# Patient Record
Sex: Male | Born: 1955 | Race: White | Hispanic: No | Marital: Married | Smoking: Never smoker
Health system: Southern US, Community
[De-identification: ages and names within clinical notes are randomized; demographics above are authoritative.]

## PROBLEM LIST (undated history)

## (undated) DIAGNOSIS — I10 Essential (primary) hypertension: Secondary | ICD-10-CM

## (undated) HISTORY — PX: CHOLECYSTECTOMY: SHX55

## (undated) HISTORY — DX: Essential (primary) hypertension: I10

## (undated) HISTORY — PX: TONSILLECTOMY: SUR1361

## (undated) HISTORY — PX: VENTRAL HERNIA REPAIR: SHX424

---

## 2011-09-12 DIAGNOSIS — F32A Depression, unspecified: Secondary | ICD-10-CM | POA: Insufficient documentation

## 2011-10-10 DIAGNOSIS — G2581 Restless legs syndrome: Secondary | ICD-10-CM | POA: Insufficient documentation

## 2012-02-27 DIAGNOSIS — I10 Essential (primary) hypertension: Secondary | ICD-10-CM | POA: Insufficient documentation

## 2012-02-27 DIAGNOSIS — F410 Panic disorder [episodic paroxysmal anxiety] without agoraphobia: Secondary | ICD-10-CM | POA: Insufficient documentation

## 2012-03-30 DIAGNOSIS — M549 Dorsalgia, unspecified: Secondary | ICD-10-CM | POA: Insufficient documentation

## 2012-05-24 DIAGNOSIS — R4020 Unspecified coma: Secondary | ICD-10-CM | POA: Insufficient documentation

## 2012-05-24 DIAGNOSIS — G4752 REM sleep behavior disorder: Secondary | ICD-10-CM | POA: Insufficient documentation

## 2012-11-19 DIAGNOSIS — R7303 Prediabetes: Secondary | ICD-10-CM | POA: Insufficient documentation

## 2014-03-23 DIAGNOSIS — E7800 Pure hypercholesterolemia, unspecified: Secondary | ICD-10-CM | POA: Insufficient documentation

## 2014-10-11 ENCOUNTER — Ambulatory Visit
Admit: 2014-10-11 | Disposition: A | Discharge status: Home / Self Care | Payer: Self-pay | Attending: Internal Medicine | Admitting: Internal Medicine

## 2014-10-11 LAB — RAPID STREP-A WITH REFLX: Micro Text Report: POSITIVE

## 2016-10-15 IMAGING — CR DG CHEST 2V
2 series · 2 of 2 positions shown · non-contrast
Comparison: None.

CLINICAL DATA: cough x10 days with congestion. Positive for Strep.
Pain from cough on left chest area. Pt denies hx of trauma, illness
or surgery.

EXAM:
CHEST  2 VIEW

[chest pa]
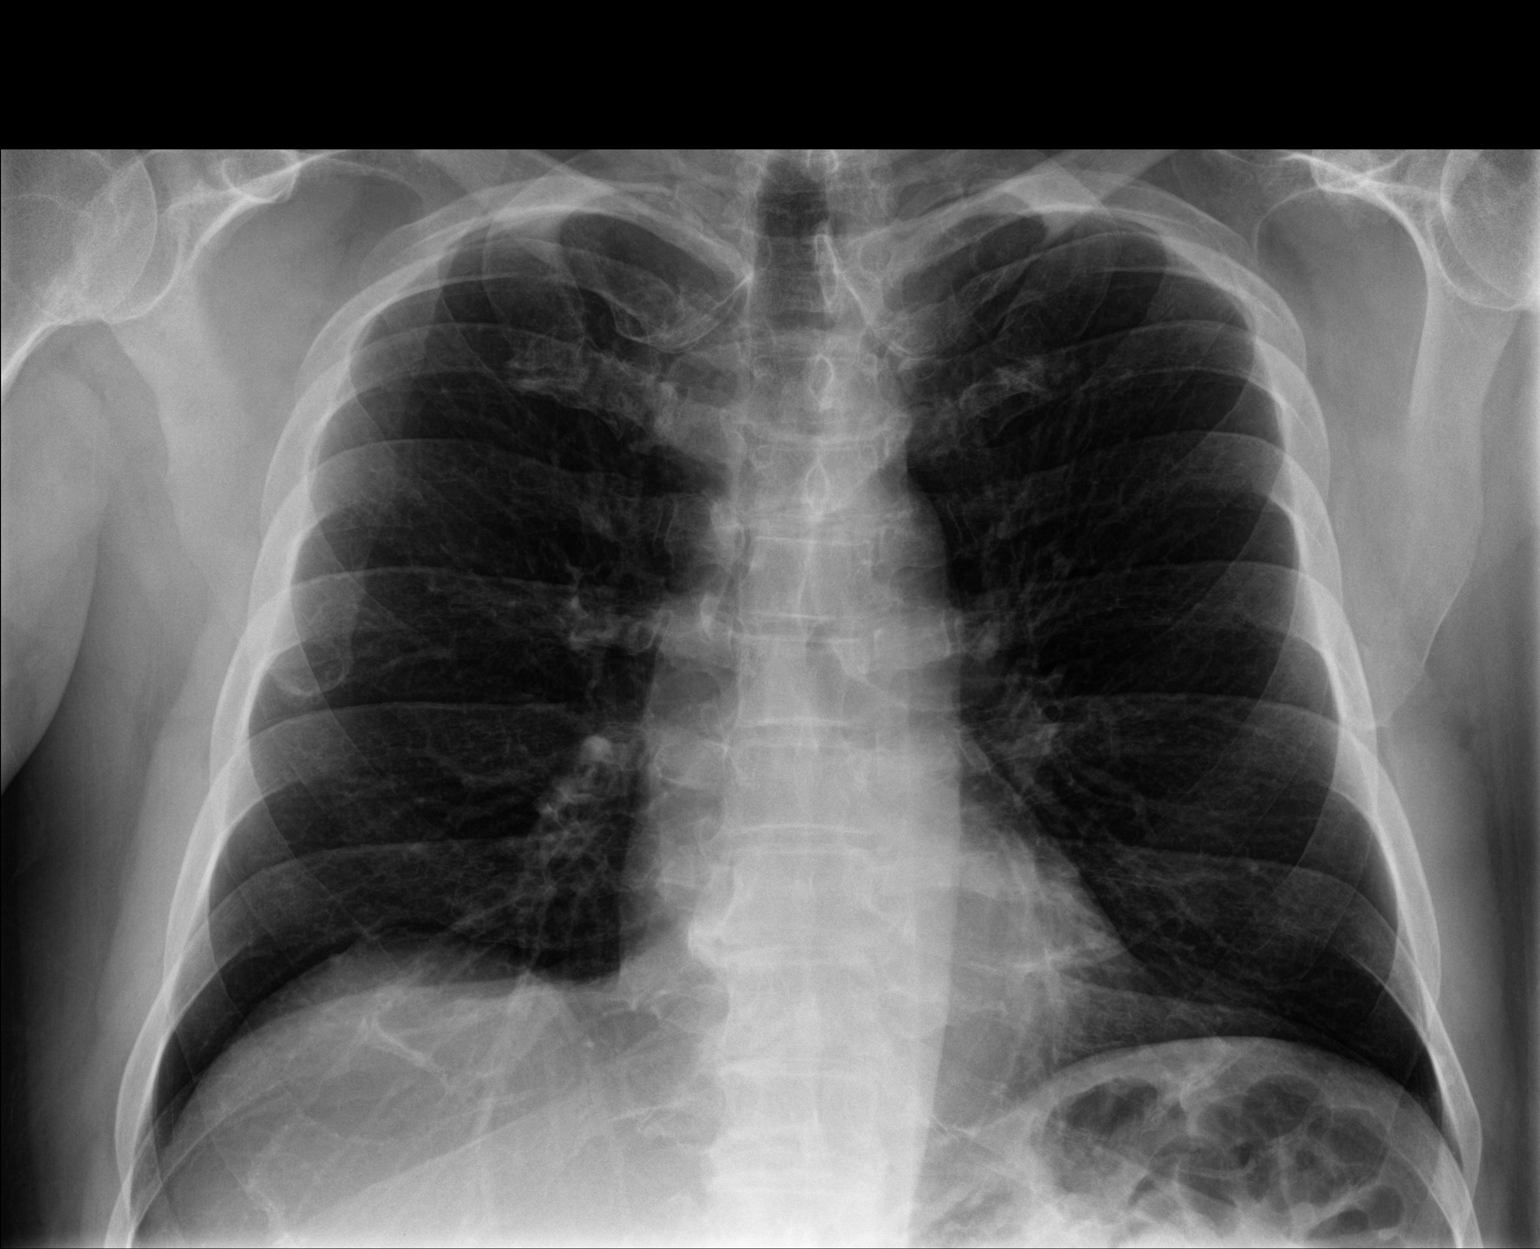

[chest lat]
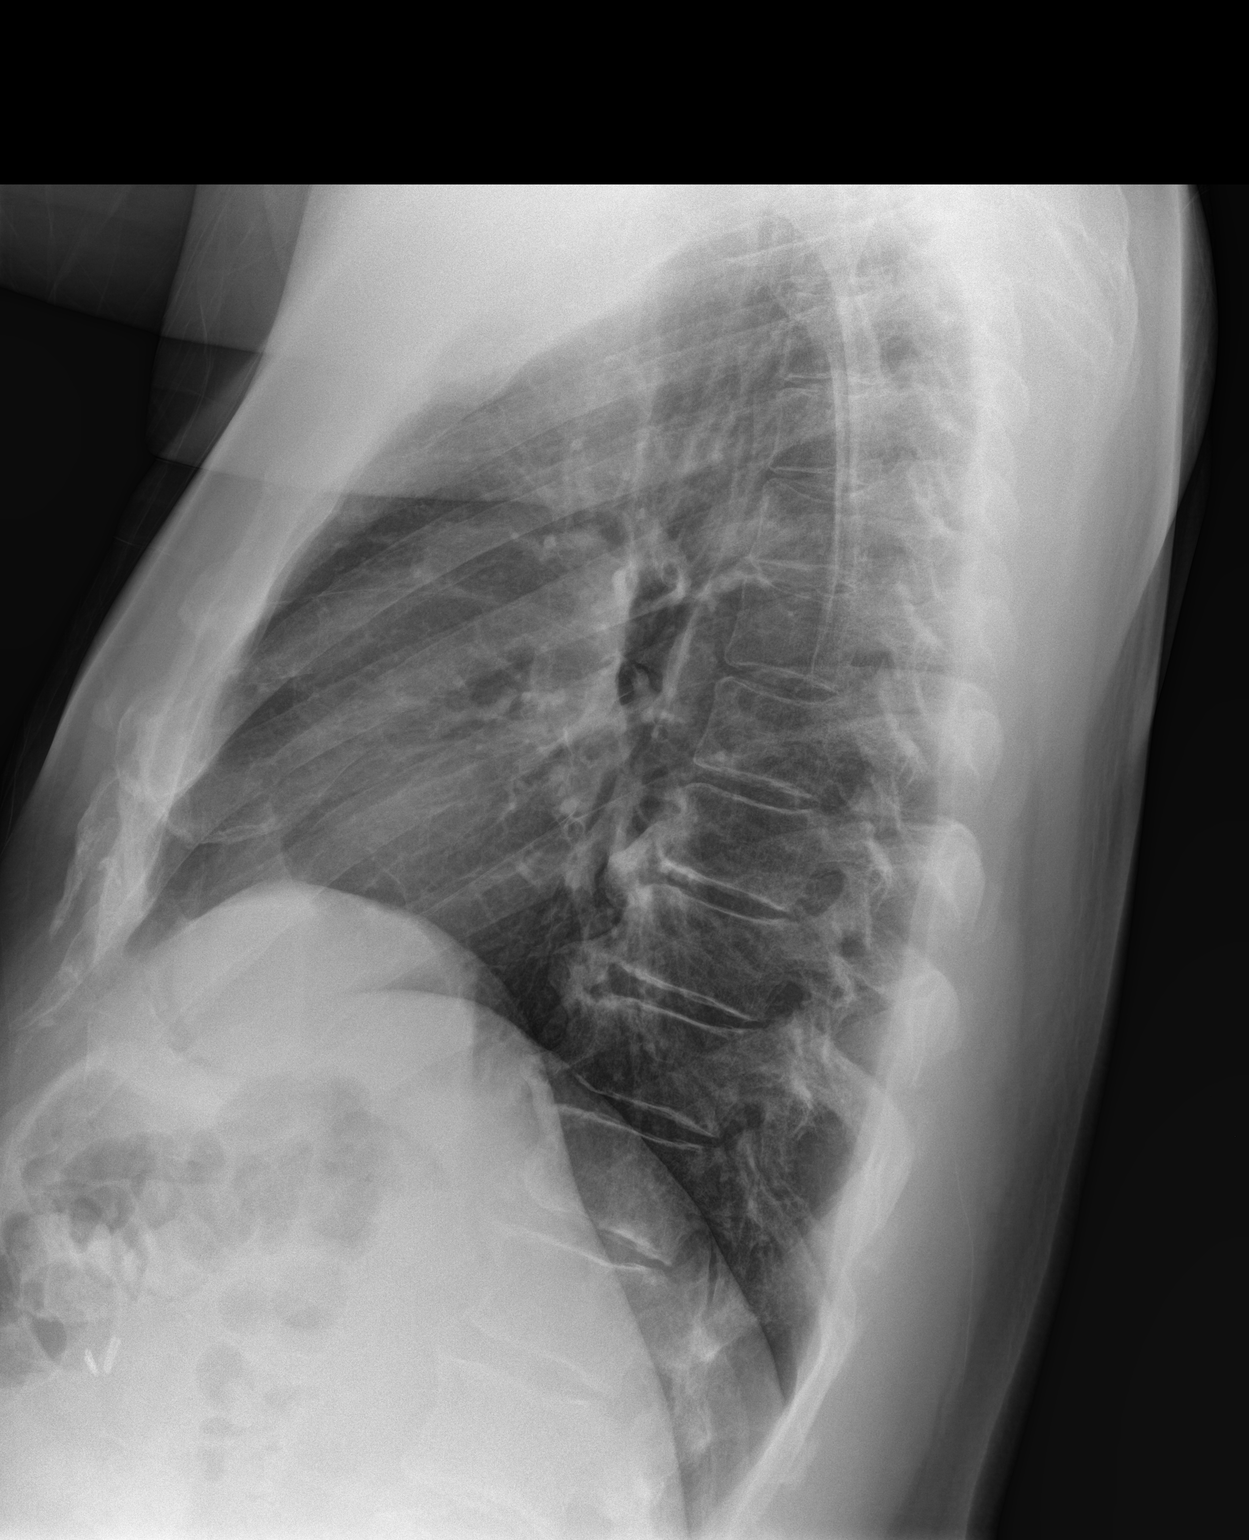

[2 of 2 positions shown; findings below may reference images not displayed]

FINDINGS: The heart size and mediastinal contours are within normal limits.
Both lungs are clear. Anterior endplate spurring noted along the
lower thoracic spine. Bony thorax is intact.
IMPRESSION: No active cardiopulmonary disease.

## 2022-10-24 ENCOUNTER — Encounter: Payer: Self-pay | Admitting: Emergency Medicine

## 2022-10-24 ENCOUNTER — Ambulatory Visit
Admission: EM | Admit: 2022-10-24 | Discharge: 2022-10-24 | Disposition: A | Discharge status: Home / Self Care | Payer: Federal, State, Local not specified - PPO | Attending: Family Medicine | Admitting: Family Medicine

## 2022-10-24 DIAGNOSIS — R112 Nausea with vomiting, unspecified: Secondary | ICD-10-CM | POA: Diagnosis present

## 2022-10-24 DIAGNOSIS — R1084 Generalized abdominal pain: Secondary | ICD-10-CM | POA: Insufficient documentation

## 2022-10-24 DIAGNOSIS — R197 Diarrhea, unspecified: Secondary | ICD-10-CM | POA: Diagnosis present

## 2022-10-24 DIAGNOSIS — E860 Dehydration: Secondary | ICD-10-CM | POA: Insufficient documentation

## 2022-10-24 LAB — COMPREHENSIVE METABOLIC PANEL
ALT: 51 U/L — ABNORMAL HIGH (ref 0–44)
AST: 37 U/L (ref 15–41)
Albumin: 4.8 g/dL (ref 3.5–5.0)
Alkaline Phosphatase: 80 U/L (ref 38–126)
Anion gap: 10 (ref 5–15)
BUN: 23 mg/dL (ref 8–23)
CO2: 23 mmol/L (ref 22–32)
Calcium: 9.1 mg/dL (ref 8.9–10.3)
Chloride: 98 mmol/L (ref 98–111)
Creatinine, Ser: 1.06 mg/dL (ref 0.61–1.24)
GFR, Estimated: >60 mL/min (ref >60)
Glucose, Bld: 138 mg/dL — ABNORMAL HIGH (ref 70–99)
Potassium: 3.2 mmol/L — ABNORMAL LOW (ref 3.5–5.1)
Sodium: 131 mmol/L — ABNORMAL LOW (ref 135–145)
Total Bilirubin: 1.7 mg/dL — ABNORMAL HIGH (ref 0.3–1.2)
Total Protein: 8.2 g/dL — ABNORMAL HIGH (ref 6.5–8.1)

## 2022-10-24 LAB — CBC WITH DIFFERENTIAL/PLATELET
Abs Immature Granulocytes: 0.03 10*3/uL (ref 0.00–0.07)
Basophils Absolute: 0.1 10*3/uL (ref 0.0–0.1)
Basophils Relative: 1 %
Eosinophils Absolute: 0.1 10*3/uL (ref 0.0–0.5)
Eosinophils Relative: 1 %
HCT: 51.9 % (ref 39.0–52.0)
Hemoglobin: 18.3 g/dL — ABNORMAL HIGH (ref 13.0–17.0)
Immature Granulocytes: 0 %
Lymphocytes Relative: 17 %
Lymphs Abs: 1.5 10*3/uL (ref 0.7–4.0)
MCH: 30.2 pg (ref 26.0–34.0)
MCHC: 35.3 g/dL (ref 30.0–36.0)
MCV: 85.6 fL (ref 80.0–100.0)
Monocytes Absolute: 0.5 10*3/uL (ref 0.1–1.0)
Monocytes Relative: 6 %
Neutro Abs: 6.5 10*3/uL (ref 1.7–7.7)
Neutrophils Relative %: 75 %
Platelets: 180 10*3/uL (ref 150–400)
RBC: 6.06 MIL/uL — ABNORMAL HIGH (ref 4.22–5.81)
RDW: 13.1 % (ref 11.5–15.5)
WBC: 8.6 10*3/uL (ref 4.0–10.5)
nRBC: 0 % (ref 0.0–0.2)

## 2022-10-24 LAB — LIPASE, BLOOD: Lipase: 29 U/L (ref 11–51)

## 2022-10-24 MED ORDER — ONDANSETRON HCL 4 MG/2ML IJ SOLN
4.0000 mg | Freq: Once | INTRAMUSCULAR | Status: AC
  Administered 2022-10-24: 4 mg via INTRAVENOUS

## 2022-10-24 MED ORDER — ONDANSETRON 4 MG PO TBDP: 4.0000 mg | ORAL_TABLET | Freq: Three times a day (TID) | ORAL | 0 refills | Status: AC | PRN

## 2022-10-24 MED ORDER — POTASSIUM CHLORIDE CRYS ER 20 MEQ PO TBCR: 40.0000 meq | EXTENDED_RELEASE_TABLET | Freq: Two times a day (BID) | ORAL | 0 refills | Status: AC

## 2022-10-24 MED ORDER — SODIUM CHLORIDE 0.9 % IV BOLUS
1000.0000 mL | Freq: Once | INTRAVENOUS | Status: AC
  Administered 2022-10-24: 1000 mL via INTRAVENOUS

## 2022-10-24 NOTE — ED Provider Notes (Signed)
[facility]    CSN: [id number] Arrival date & time: 10/24/22  1101      History   Chief Complaint Chief Complaint  Patient presents with   Emesis   Diarrhea    HPI Jihaad Hagg is a 67 y.o. male.   HPI   Tudor presents for vomiting and diarrhea that started on Thursday morning.  Has nonbloody emesis and diarrhea.  Drank some Gatorade. Vomiting has improved.  No episodes of vomiting in about 24 hours.  He lost 10 lbs. Wife gave him some pheneragan which he vomited back up.  Takes protonix and antihypertensive medications daily but has not been able to do so due to his vomiting.  He has an episode of diarrhea every 2-3 hours.  He has had chills but no fevers.  Tmax 99 F.  Has no respiratory symptoms, shortness of breath, chest pain or headache. Denies dizziness.  He is wife is well.  Notes that the only medication change recently was that his amlodipine was switched from 5 mg to 10 mg at the [place] 2 weeks ago.     History reviewed. No pertinent past medical history.  There are no problems to display for this patient.   History reviewed. No pertinent surgical history.     Home Medications    Prior to Admission medications   Medication Sig Start Date End Date Taking? Authorizing Provider  amLODipine (NORVASC) 5 MG tablet Take 10 mg by mouth daily. 06/27/22  Yes [provider]  cyclobenzaprine (FLEXERIL) 10 MG tablet Take by mouth. 08/19/14  Yes [provider]  ondansetron (ZOFRAN-ODT) 4 MG disintegrating tablet Take 1-2 tablets (4-8 mg total) by mouth every 8 (eight) hours as needed. 10/24/22  Yes Caedin Mogan, DO  potassium chloride SA (KLOR-CON M) 20 MEQ tablet Take 2 tablets (40 mEq total) by mouth 2 (two) times daily for 2 doses. 10/24/22 10/25/22 Yes Sonny Poth, DO  SUMAtriptan (IMITREX) 50 MG tablet Take by mouth.    [provider]    Family History No family history on file.  Social History Social History    Tobacco Use   Smoking status: Never   Smokeless tobacco: Never  Vaping Use   Vaping Use: Never used  Substance Use Topics   Alcohol use: Not Currently   Drug use: Never     Allergies   Cephalexin   Review of Systems Review of Systems: negative unless otherwise stated in HPI.      Physical Exam Triage Vital Signs ED Triage Vitals  Enc Vitals Group     BP 10/24/22 1157 (!) 124/101     Pulse Rate 10/24/22 1157 (!) 112     Resp 10/24/22 1157 18     Temp 10/24/22 1157 98.2 F (36.8 C)     Temp Source 10/24/22 1157 Oral     SpO2 10/24/22 1157 98 %     Weight --      Height --      Head Circumference --      Peak Flow --      Pain Score 10/24/22 1154 0     Pain Loc --      Pain Edu? --      Excl. in GC? --    No data found.  Updated Vital Signs BP (!) 124/101 (BP Location: Left Arm)   Pulse (!) 112   Temp 98.2 F (36.8 C) (Oral)   Resp 18   SpO2 98%   Visual  Acuity Right Eye Distance:   Left Eye Distance:   Bilateral Distance:    Right Eye Near:   Left Eye Near:    Bilateral Near:     Physical Exam GEN:     alert, non-toxic elderly male and no distress    HENT:  mucus membranes tachy, oropharyngeal without lesions or erythema,  nares patent, no nasal discharge  EYES:   pupils equal and reactive, EOM intact NECK:  supple, normal ROM RESP:  clear to auscultation bilaterally, no increased work of breathing  CVS:   regular rhythm, tachycardic ABD:  soft, generalized tenderness; bowel sounds present; no palpable masses,   EXT:   No edema, baseline ROM NEURO:  normal without focal findings,  speech normal, alert and oriented   Skin:   warm and dry, no rash Psych: Normal affect, appropriate speech and behavior      UC Treatments / Results  Labs (all labs ordered are listed, but only abnormal results are displayed) Labs Reviewed  COMPREHENSIVE METABOLIC PANEL - Abnormal; Notable for the following components:      Result Value   Sodium 131 (*)     Potassium 3.2 (*)    Glucose, Bld 138 (*)    Total Protein 8.2 (*)    ALT 51 (*)    Total Bilirubin 1.7 (*)    All other components within normal limits  CBC WITH DIFFERENTIAL/PLATELET - Abnormal; Notable for the following components:   RBC 6.06 (*)    Hemoglobin 18.3 (*)    All other components within normal limits  LIPASE, BLOOD    EKG  If EKG performed, see my interpretation in the MDM section  Radiology No results found.   Procedures Procedures (including critical care time)  Medications Ordered in UC Medications  ondansetron (ZOFRAN) injection 4 mg (4 mg Intravenous Given 10/24/22 1327)  sodium chloride 0.9 % bolus 1,000 mL (0 mLs Intravenous Stopped 10/24/22 1400)    Initial Impression / Assessment and Plan / UC Course  I have reviewed the triage vital signs and the nursing notes.  Pertinent labs & imaging results that were available during my care of the patient were reviewed by me and considered in my medical decision making (see chart for details).       Patient is a 67 y.o. male  who presents for vomiting, diarrhea and abdominal pain.  Overall patient is nontoxic-appearing and afebrile.  He is tachycardic.  Eating well on room air.  On exam, he has tacky mucous membranes and generalized abdominal tenderness.  Lipase not concerning for acute pancreatitis.  He does have evidence of hemoconcentration on CBC but there is no leukocytosis.  He has a mild hyponatremia, sodium 131.  He was given 1 L normal saline which will likely resolve this.  He has a mild hypokalemia, potassium 3.2.  Discharged with repletion.  His total bilirubin and ALT are are mildly elevated.  I do not suspect an acute abdominal pathology at this time.  Suspect viral gastroenteritis.  If his symptoms do not resolve over the next 48 hours patient is to go to the emergency department for advanced imaging of his abdomen.   ED and return precautions given and patient and his wife voiced understanding.  Discussed MDM, treatment plan and plan for follow-up with patient who agrees with plan.     Final Clinical Impressions(s) / UC Diagnoses   Final diagnoses:  Nausea vomiting and diarrhea  Generalized abdominal pain  Dehydration  Discharge Instructions      You are given nausea medicine and IV fluids here.  Your potassium is still somewhat low.  Stop by the pharmacy to pick up your potassium supplements and antinausea medication.  If your pain does not resolve in the next 48 to 24 hours, go to the emergency department as you may need advanced imaging of your abdomen.     ED Prescriptions     Medication Sig Dispense Auth. Provider   potassium chloride SA (KLOR-CON M) 20 MEQ tablet Take 2 tablets (40 mEq total) by mouth 2 (two) times daily for 2 doses. 4 tablet Mikiala Fugett, DO   ondansetron (ZOFRAN-ODT) 4 MG disintegrating tablet Take 1-2 tablets (4-8 mg total) by mouth every 8 (eight) hours as needed. 20 tablet Katha Cabal, DO      PDMP not reviewed this encounter.   Katha Cabal, DO 10/24/22 1706

## 2022-10-24 NOTE — ED Triage Notes (Signed)
Pt presents with abdominal pain, vomiting and diarrhea x 3-4 days

## 2022-10-24 NOTE — Discharge Instructions (Addendum)
You are given nausea medicine and IV fluids here.  Your potassium is still somewhat low.  Stop by the pharmacy to pick up your potassium supplements and antinausea medication.  If your pain does not resolve in the next 48 to 24 hours, go to the emergency department as you may need advanced imaging of your abdomen.

## 2022-12-12 ENCOUNTER — Other Ambulatory Visit: Payer: Self-pay | Admitting: Physical Medicine & Rehabilitation

## 2022-12-12 DIAGNOSIS — M5441 Lumbago with sciatica, right side: Secondary | ICD-10-CM

## 2022-12-28 ENCOUNTER — Ambulatory Visit
Admission: RE | Admit: 2022-12-28 | Discharge: 2022-12-28 | Disposition: A | Discharge status: Home / Self Care | Payer: Non-veteran care | Source: Ambulatory Visit | Attending: Physical Medicine & Rehabilitation | Admitting: Physical Medicine & Rehabilitation

## 2022-12-28 DIAGNOSIS — M5441 Lumbago with sciatica, right side: Secondary | ICD-10-CM

## 2023-01-19 ENCOUNTER — Ambulatory Visit
Admission: EM | Admit: 2023-01-19 | Discharge: 2023-01-19 | Payer: No Typology Code available for payment source | Attending: Family Medicine | Admitting: Family Medicine

## 2023-01-19 ENCOUNTER — Emergency Department: Payer: No Typology Code available for payment source

## 2023-01-19 ENCOUNTER — Encounter: Payer: Self-pay | Admitting: Internal Medicine

## 2023-01-19 ENCOUNTER — Observation Stay
Admission: EM | Admit: 2023-01-19 | Discharge: 2023-01-21 | Disposition: A | Discharge status: Home / Self Care | Payer: No Typology Code available for payment source | Attending: Internal Medicine | Admitting: Internal Medicine

## 2023-01-19 ENCOUNTER — Other Ambulatory Visit: Payer: Self-pay

## 2023-01-19 DIAGNOSIS — Z79899 Other long term (current) drug therapy: Secondary | ICD-10-CM | POA: Diagnosis not present

## 2023-01-19 DIAGNOSIS — I471 Supraventricular tachycardia, unspecified: Secondary | ICD-10-CM | POA: Insufficient documentation

## 2023-01-19 DIAGNOSIS — E669 Obesity, unspecified: Secondary | ICD-10-CM | POA: Diagnosis present

## 2023-01-19 DIAGNOSIS — J310 Chronic rhinitis: Secondary | ICD-10-CM | POA: Insufficient documentation

## 2023-01-19 DIAGNOSIS — R072 Precordial pain: Secondary | ICD-10-CM | POA: Diagnosis present

## 2023-01-19 DIAGNOSIS — R079 Chest pain, unspecified: Secondary | ICD-10-CM | POA: Diagnosis present

## 2023-01-19 DIAGNOSIS — I10 Essential (primary) hypertension: Secondary | ICD-10-CM | POA: Diagnosis not present

## 2023-01-19 DIAGNOSIS — F32A Depression, unspecified: Secondary | ICD-10-CM | POA: Diagnosis present

## 2023-01-19 DIAGNOSIS — Z87898 Personal history of other specified conditions: Secondary | ICD-10-CM | POA: Insufficient documentation

## 2023-01-19 DIAGNOSIS — I959 Hypotension, unspecified: Secondary | ICD-10-CM | POA: Insufficient documentation

## 2023-01-19 DIAGNOSIS — Z7189 Other specified counseling: Secondary | ICD-10-CM | POA: Insufficient documentation

## 2023-01-19 DIAGNOSIS — E876 Hypokalemia: Secondary | ICD-10-CM | POA: Diagnosis not present

## 2023-01-19 DIAGNOSIS — I4891 Unspecified atrial fibrillation: Secondary | ICD-10-CM | POA: Diagnosis not present

## 2023-01-19 DIAGNOSIS — R55 Syncope and collapse: Secondary | ICD-10-CM | POA: Insufficient documentation

## 2023-01-19 DIAGNOSIS — M25569 Pain in unspecified knee: Secondary | ICD-10-CM | POA: Insufficient documentation

## 2023-01-19 DIAGNOSIS — Z9289 Personal history of other medical treatment: Secondary | ICD-10-CM | POA: Insufficient documentation

## 2023-01-19 DIAGNOSIS — K219 Gastro-esophageal reflux disease without esophagitis: Secondary | ICD-10-CM | POA: Insufficient documentation

## 2023-01-19 DIAGNOSIS — H903 Sensorineural hearing loss, bilateral: Secondary | ICD-10-CM | POA: Insufficient documentation

## 2023-01-19 DIAGNOSIS — H40009 Preglaucoma, unspecified, unspecified eye: Secondary | ICD-10-CM | POA: Insufficient documentation

## 2023-01-19 DIAGNOSIS — G44209 Tension-type headache, unspecified, not intractable: Secondary | ICD-10-CM | POA: Insufficient documentation

## 2023-01-19 DIAGNOSIS — G4733 Obstructive sleep apnea (adult) (pediatric): Secondary | ICD-10-CM

## 2023-01-19 DIAGNOSIS — F54 Psychological and behavioral factors associated with disorders or diseases classified elsewhere: Secondary | ICD-10-CM | POA: Insufficient documentation

## 2023-01-19 DIAGNOSIS — G4730 Sleep apnea, unspecified: Secondary | ICD-10-CM | POA: Insufficient documentation

## 2023-01-19 DIAGNOSIS — Z111 Encounter for screening for respiratory tuberculosis: Secondary | ICD-10-CM | POA: Insufficient documentation

## 2023-01-19 LAB — COMPREHENSIVE METABOLIC PANEL
ALT: 25 U/L (ref 0–44)
AST: 20 U/L (ref 15–41)
Albumin: 4.9 g/dL (ref 3.5–5.0)
Alkaline Phosphatase: 73 U/L (ref 38–126)
Anion gap: 8 (ref 5–15)
BUN: 14 mg/dL (ref 8–23)
CO2: 25 mmol/L (ref 22–32)
Calcium: 9.1 mg/dL (ref 8.9–10.3)
Chloride: 102 mmol/L (ref 98–111)
Creatinine, Ser: 0.68 mg/dL (ref 0.61–1.24)
GFR, Estimated: >60 mL/min (ref >60)
Glucose, Bld: 122 mg/dL — ABNORMAL HIGH (ref 70–99)
Potassium: 3.4 mmol/L — ABNORMAL LOW (ref 3.5–5.1)
Sodium: 135 mmol/L (ref 135–145)
Total Bilirubin: 1.2 mg/dL (ref 0.3–1.2)
Total Protein: 7.6 g/dL (ref 6.5–8.1)

## 2023-01-19 LAB — CBC
HCT: 48.4 % (ref 39.0–52.0)
Hemoglobin: 16.6 g/dL (ref 13.0–17.0)
MCH: 30.2 pg (ref 26.0–34.0)
MCHC: 34.3 g/dL (ref 30.0–36.0)
MCV: 88 fL (ref 80.0–100.0)
Platelets: 178 10*3/uL (ref 150–400)
RBC: 5.5 MIL/uL (ref 4.22–5.81)
RDW: 13.4 % (ref 11.5–15.5)
WBC: 9.7 10*3/uL (ref 4.0–10.5)
nRBC: 0 % (ref 0.0–0.2)

## 2023-01-19 LAB — URINE DRUG SCREEN, QUALITATIVE (ARMC ONLY)
Amphetamines, Ur Screen: NOT DETECTED
Barbiturates, Ur Screen: NOT DETECTED
Benzodiazepine, Ur Scrn: NOT DETECTED
Cannabinoid 50 Ng, Ur ~~LOC~~: POSITIVE — AB
Cocaine Metabolite,Ur ~~LOC~~: NOT DETECTED
MDMA (Ecstasy)Ur Screen: NOT DETECTED
Methadone Scn, Ur: NOT DETECTED
Opiate, Ur Screen: NOT DETECTED
Phencyclidine (PCP) Ur S: NOT DETECTED
Tricyclic, Ur Screen: NOT DETECTED

## 2023-01-19 LAB — HEPARIN LEVEL (UNFRACTIONATED): Heparin Unfractionated: 0.41 IU/mL (ref 0.30–0.70)

## 2023-01-19 LAB — TSH: TSH: 7.315 u[IU]/mL — ABNORMAL HIGH (ref 0.350–4.500)

## 2023-01-19 LAB — TROPONIN I (HIGH SENSITIVITY)
Troponin I (High Sensitivity): 15 ng/L (ref <18)
Troponin I (High Sensitivity): 18 ng/L — ABNORMAL HIGH (ref <18)
Troponin I (High Sensitivity): 14 ng/L (ref <18)

## 2023-01-19 LAB — MAGNESIUM: Magnesium: 2.2 mg/dL (ref 1.7–2.4)

## 2023-01-19 LAB — PROTIME-INR
INR: 1 (ref 0.8–1.2)
Prothrombin Time: 13.5 seconds (ref 11.4–15.2)

## 2023-01-19 LAB — APTT: aPTT: 27 seconds (ref 24–36)

## 2023-01-19 LAB — BRAIN NATRIURETIC PEPTIDE: B Natriuretic Peptide: 328.2 pg/mL — ABNORMAL HIGH (ref 0.0–100.0)

## 2023-01-19 MED ORDER — DILTIAZEM HCL-DEXTROSE 125-5 MG/125ML-% IV SOLN (PREMIX)
5.0000 mg/h | INTRAVENOUS | Status: DC
  Administered 2023-01-19: 5 mg/h via INTRAVENOUS
  Administered 2023-01-19 – 2023-01-20 (×2): 15 mg/h via INTRAVENOUS
  Filled 2023-01-19 (×3): qty 125

## 2023-01-19 MED ORDER — ACETAMINOPHEN 325 MG PO TABS
650.0000 mg | ORAL_TABLET | Freq: Four times a day (QID) | ORAL | Status: DC | PRN
  Administered 2023-01-19 – 2023-01-20 (×3): 650 mg via ORAL
  Filled 2023-01-19 (×3): qty 2

## 2023-01-19 MED ORDER — HEPARIN BOLUS VIA INFUSION
6000.0000 [IU] | Freq: Once | INTRAVENOUS | Status: AC
  Administered 2023-01-19: 6000 [IU] via INTRAVENOUS
  Filled 2023-01-19: qty 6000

## 2023-01-19 MED ORDER — ONDANSETRON HCL 4 MG/2ML IJ SOLN: 4.0000 mg | Freq: Three times a day (TID) | INTRAMUSCULAR | Status: DC | PRN

## 2023-01-19 MED ORDER — DILTIAZEM LOAD VIA INFUSION
10.0000 mg | Freq: Once | INTRAVENOUS | Status: AC
  Administered 2023-01-19: 10 mg via INTRAVENOUS
  Filled 2023-01-19: qty 10

## 2023-01-19 MED ORDER — POTASSIUM CHLORIDE CRYS ER 20 MEQ PO TBCR
40.0000 meq | EXTENDED_RELEASE_TABLET | Freq: Once | ORAL | Status: AC
  Administered 2023-01-19: 40 meq via ORAL
  Filled 2023-01-19: qty 2

## 2023-01-19 MED ORDER — HYDRALAZINE HCL 20 MG/ML IJ SOLN: 5.0000 mg | INTRAMUSCULAR | Status: DC | PRN

## 2023-01-19 MED ORDER — PANTOPRAZOLE SODIUM 40 MG PO TBEC
40.0000 mg | DELAYED_RELEASE_TABLET | Freq: Every day | ORAL | Status: DC
  Administered 2023-01-19 – 2023-01-20 (×2): 40 mg via ORAL
  Filled 2023-01-19 (×3): qty 1

## 2023-01-19 MED ORDER — HEPARIN (PORCINE) 25000 UT/250ML-% IV SOLN
1400.0000 [IU]/h | INTRAVENOUS | Status: DC
  Administered 2023-01-19 – 2023-01-20 (×2): 1400 [IU]/h via INTRAVENOUS
  Filled 2023-01-19 (×2): qty 250

## 2023-01-19 NOTE — Progress Notes (Signed)
   01/19/23 2324  BiPAP/CPAP/SIPAP  Reason BIPAP/CPAP not in use Non-compliant;Other(comment) (Patient declined CPAP at this time)

## 2023-01-19 NOTE — ED Provider Notes (Signed)
[facility]    CSN: [id number] Arrival date & time: 01/19/23  1214      History   Chief Complaint Chief Complaint  Patient presents with   Chest Pain   Tachycardia   Headache   Hypotension    HPI Neil Lopez is a 67 y.o. male.   Rapid heart rate associated with chest pain.  Sudden onset.  Started yesterday afternoon.  Associated with lightheadedness.  No dizziness.  No visual disturbance.  No prior cardiac history.  Taking amlodipine.  No recent changes in medication.  Had nuclear stress test done 1 year ago at [facility].  It was negative.  Denies any emotional stress or excessive caffeine intake.  Chest pain is episodic.  Comes and goes.  Dull pain.  Palpitations and rapid heart rate persist.  He tried to lay down.  It did not help.   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   Psychological and behavioral factors associated with disorders or diseases classified elsewhere 01/19/2023   Screening examination for pulmonary tuberculosis 01/19/2023   Sensorineural hearing loss, bilateral 01/19/2023   Sleep apnea 01/19/2023   Syncope and collapse 01/19/2023   Tension type headache 01/19/2023   Hypercholesterolemia 03/23/2014   Prediabetes 11/19/2012   Loss of consciousness (HCC) 05/24/2012   RBD (REM behavioral disorder) 05/24/2012   Back pain 03/30/2012   Essential (primary) hypertension 02/27/2012   Panic attacks 02/27/2012   Restless legs syndrome (RLS) 10/10/2011   Depression 09/12/2011    History reviewed. No pertinent surgical  history.     Home Medications    Prior to Admission medications   Medication Sig Start Date End Date Taking? Authorizing Provider  amLODipine (NORVASC) 5 MG tablet Take 10 mg by mouth daily. 06/27/22  Yes [provider]  cyclobenzaprine (FLEXERIL) 10 MG tablet Take by mouth. 08/19/14  Yes [provider]  SUMAtriptan (IMITREX) 50 MG tablet Take by mouth.   Yes [provider]  ondansetron (ZOFRAN-ODT) 4 MG disintegrating tablet Take 1-2 tablets (4-8 mg total) by mouth every 8 (eight) hours as needed. 10/24/22   Brimage, Seward Meth, DO  potassium chloride SA (KLOR-CON M) 20 MEQ tablet Take 2 tablets (40 mEq total) by mouth 2 (two) times daily for 2 doses. 10/24/22 10/25/22  Katha Cabal, DO    Family History History reviewed. No pertinent family history.  Social History Social History   Tobacco Use   Smoking status: Never   Smokeless tobacco: Never  Vaping Use   Vaping status: Never Used  Substance Use Topics   Alcohol use: Not Currently   Drug use: Never     Allergies   Cephalexin and Pramipexole   Review of Systems Review of Systems  Cardiovascular:  Positive for chest pain.  Neurological:  Positive for headaches.  Negative other than stated in HPI.   Physical Exam Triage Vital Signs ED Triage Vitals  Encounter Vitals Group     BP 01/19/23 1220 95/72     Systolic BP Percentile --      Diastolic BP Percentile --  Pulse Rate 01/19/23 1220 86     Resp 01/19/23 1220 16     Temp 01/19/23 1220 98.2 F (36.8 C)     Temp Source 01/19/23 1220 Oral     SpO2 01/19/23 1220 97 %     Weight 01/19/23 1218 240 lb (108.9 kg)     Height 01/19/23 1218 6' (1.829 m)     Head Circumference --      Peak Flow --      Pain Score 01/19/23 1224 2     Pain Loc --      Pain Education --      Exclude from Growth Chart --    No data found.  Updated Vital Signs BP 95/72 (BP Location: Right Arm)   Pulse 86   Temp 98.2 F (36.8 C) (Oral)   Resp 16    Ht 6' (1.829 m)   Wt 108.9 kg   SpO2 97%   BMI 32.55 kg/m   Visual Acuity Right Eye Distance:   Left Eye Distance:   Bilateral Distance:    Right Eye Near:   Left Eye Near:    Bilateral Near:     Physical Exam Vitals and nursing note reviewed.  Constitutional:      Appearance: He is well-developed and normal weight.  HENT:     Head: Normocephalic and atraumatic.  Eyes:     Extraocular Movements: Extraocular movements intact.     Pupils: Pupils are equal, round, and reactive to light.  Cardiovascular:     Rate and Rhythm: Tachycardia present. Rhythm irregular. No extrasystoles are present.    Pulses:          Carotid pulses are 2+ on the right side and 2+ on the left side.      Radial pulses are 2+ on the right side and 2+ on the left side.       Dorsalis pedis pulses are 2+ on the right side and 2+ on the left side.       Posterior tibial pulses are 2+ on the right side and 2+ on the left side.     Heart sounds: No murmur heard. Pulmonary:     Effort: Pulmonary effort is normal.  Chest:     Chest wall: No tenderness.  Abdominal:     Palpations: Abdomen is soft.  Musculoskeletal:        General: Normal range of motion.     Cervical back: Normal range of motion and neck supple.  Skin:    General: Skin is warm.     Capillary Refill: Capillary refill takes less than 2 seconds.  Neurological:     General: No focal deficit present.     Mental Status: He is alert.  Psychiatric:        Mood and Affect: Mood normal.        Behavior: Behavior normal.      UC Treatments / Results  Labs (all labs ordered are listed, but only abnormal results are displayed) Labs Reviewed  TROPONIN I (HIGH SENSITIVITY)    EKG   Radiology No results found.  Procedures Procedures (including critical care time)  Medications Ordered in UC Medications - No data to display  Initial Impression / Assessment and Plan / UC Course  I have reviewed the triage vital signs and the  nursing notes.  Pertinent labs & imaging results that were available during my care of the patient were reviewed by me and considered in my medical decision making (  see chart for details).  Clinical Course as of 01/19/23 1320  Thu Jan 19, 2023  1256 Troponin I (High Sensitivity) [VJ]  1300 EKG 12-Lead [VJ]  1311 Troponin I (High Sensitivity) [VJ]  1313 Troponin I (High Sensitivity) [VJ]  1317 Troponin I (High Sensitivity): 15 [VJ]  1317 Troponin I (High Sensitivity) [VJ]    Clinical Course User Index [VJ] Lura Em, MD     Final Clinical Impressions(s) / UC Diagnoses   Final diagnoses:  SVT (supraventricular tachycardia)  Hypotension, unspecified hypotension type     Discharge Instructions      EKG was done.  EKG showed ventricular rate of 1 37 bpm with supraventricular tachycardia with PVCs along with left axis deviation and nonspecific ST-T changes.  QTc was 486 MS.  QRS duration 80 MS.  PR interval not detectable.   Transfer to emergency room for further evaluation and treatment.  To be taken to [facility] by private vehicle.       ED Prescriptions   None    PDMP not reviewed this encounter.   Lura Em, MD 01/19/23 1320

## 2023-01-19 NOTE — Progress Notes (Signed)
ANTICOAGULATION CONSULT NOTE - Initial Consult  Pharmacy Consult for Heparin Drip Indication: atrial fibrillation  Allergies  Allergen Reactions   Cephalexin Rash   Pramipexole Other (See Comments)    Patient Measurements: Height: 6' (182.9 cm) Weight: 108.9 kg (240 lb) IBW/kg (Calculated) : 77.6 Heparin Dosing Weight: 100.6 kg  Vital Signs: Temp: 98.3 F (36.8 C) (07/11 1351) Temp Source: Oral (07/11 1351) BP: 118/86 (07/11 1351) Pulse Rate: 52 (07/11 1351)  Labs: Recent Labs    01/19/23 1242 01/19/23 1354  HGB  --  16.6  HCT  --  48.4  PLT  --  178  TROPONINIHS 15  --     CrCl cannot be calculated (Patient's most recent lab result is older than the maximum 21 days allowed.).   Medical History: Past Medical History:  Diagnosis Date   Essential (primary) hypertension    Assessment: Patient is a 67yo male admitted for new onset afib. Pharmacy consulted for Heparin dosing. Review of outpatient medication history does not reveal any anticoagulant use.  Goal of Therapy:  Heparin level 0.3-0.7 units/ml Monitor platelets by anticoagulation protocol: Yes   Plan:  Give 6000 units bolus x 1 Start heparin infusion at 1400 units/hr Check anti-Xa level in 6 hours and daily while on heparin Continue to monitor H&H and platelets  Clovia Cuff, PharmD, BCPS 01/19/2023 3:49 PM

## 2023-01-19 NOTE — Discharge Instructions (Addendum)
EKG was done.  EKG showed ventricular rate of 1 37 bpm with supraventricular tachycardia with PVCs along with left axis deviation and nonspecific ST-T changes.  QTc was 486 MS.  QRS duration 80 MS.  PR interval not detectable.   Transfer to emergency room for further evaluation and treatment.  To be taken to [facility] by private vehicle.

## 2023-01-19 NOTE — ED Triage Notes (Signed)
Pt c/o chest pain,HA & tachycardia ongoing since today. States BP was low last night around 90/50. HR 120 this AM norm around high 60's-70's. Denies any blurred vision,SOB or cardiac hx. Hx of htn.

## 2023-01-19 NOTE — ED Provider Notes (Signed)
[facility] Provider Note    Event Date/Time   First MD Initiated Contact with Patient 01/19/23 1407     (approximate)   History   Tachycardia and Chest Pain   HPI  Neil Lopez is a 67 y.o. male past medical history significant for hypertension, hyperlipidemia, obesity, who presents to the emergency department with chest pain and tachycardia.  States that yesterday he had an episode of chest pain that was a substernal pain while he was at rest.  Today he went to urgent care because he thought that it was from an injection that he got in his back.  While at urgent care found to be in atrial fibrillation and tachycardic into the 160s and told to come to the emergency department.  Denies any history of A-fib.  Not on anticoagulation.  Denies any heart palpitations at this time.  No recent nausea, vomiting or diarrhea.  Tolerating p.o.  Increased urine output.  Chest pain-free at this time.     Physical Exam   Triage Vital Signs: ED Triage Vitals  Encounter Vitals Group     BP 01/19/23 1351 118/86     Systolic BP Percentile --      Diastolic BP Percentile --      Pulse Rate 01/19/23 1351 (!) 52     Resp 01/19/23 1351 18     Temp 01/19/23 1351 98.3 F (36.8 C)     Temp Source 01/19/23 1351 Oral     SpO2 01/19/23 1351 95 %     Weight --      Height --      Head Circumference --      Peak Flow --      Pain Score 01/19/23 1352 2     Pain Loc --      Pain Education --      Exclude from Growth Chart --     Most recent vital signs: Vitals:   01/19/23 1351  BP: 118/86  Pulse: (!) 52  Resp: 18  Temp: 98.3 F (36.8 C)  SpO2: 95%    Physical Exam Constitutional:      Appearance: He is well-developed.  HENT:     Head: Atraumatic.  Eyes:     Conjunctiva/sclera: Conjunctivae normal.  Cardiovascular:     Rate and Rhythm: Tachycardia present. Rhythm irregular.  Pulmonary:     Effort: No respiratory distress.     Breath sounds: No  decreased breath sounds or wheezing.  Musculoskeletal:     Cervical back: Normal range of motion.     Right lower leg: No edema.     Left lower leg: No edema.  Skin:    General: Skin is warm.     Capillary Refill: Capillary refill takes less than 2 seconds.  Neurological:     General: No focal deficit present.     Mental Status: He is alert. Mental status is at baseline.     IMPRESSION / MDM / ASSESSMENT AND PLAN / ED COURSE  I reviewed the triage vital signs and the nursing notes.  Differential diagnosis including new onset atrial fibrillation, dehydration, electrolyte abnormality, new onset heart failure, ACS  EKG  I, Corena Herter, the attending physician, personally viewed and interpreted this ECG.  Atrial fibrillation with a rapid rate in the 140s.  Normal intervals.  No significant ST elevation or depression.  No signs of acute ischemia.  Atrial fibrillation with a rapid rate up to 160 while on cardiac telemetry.  RADIOLOGY I independently reviewed imaging, my interpretation of imaging: Chest x-ray without significant cardiomegaly  LABS (all labs ordered are listed, but only abnormal results are displayed) Labs interpreted as -    Labs Reviewed  BRAIN NATRIURETIC PEPTIDE - Abnormal; Notable for the following components:      Result Value   B Natriuretic Peptide 328.2 (*)    All other components within normal limits  CBC  COMPREHENSIVE METABOLIC PANEL  TSH  MAGNESIUM  TROPONIN I (HIGH SENSITIVITY)     MDM  Ordered IV diltiazem bolus and infusion.  Started on heparin infusion for new onset atrial fibrillation.  Uncertain of onset of his atrial fibrillation since he is not feeling his tachycardia or heart palpitations, possible onset of yesterday with his onset of chest pain.  No leukocytosis or anemia.  Does not appear dehydrated.  Plan to admit to the hospital for new onset atrial fibrillation with a rapid rate     PROCEDURES:  Critical Care performed:  yes  .Critical Care  Performed by: Corena Herter, MD Authorized by: Corena Herter, MD   Critical care provider statement:    Critical care time (minutes):  30   Critical care time was exclusive of:  Separately billable procedures and treating other patients   Critical care was necessary to treat or prevent imminent or life-threatening deterioration of the following conditions:  Cardiac failure   Critical care was time spent personally by me on the following activities:  Development of treatment plan with patient or surrogate, discussions with consultants, evaluation of patient's response to treatment, examination of patient, ordering and review of laboratory studies, ordering and review of radiographic studies, ordering and performing treatments and interventions, pulse oximetry, re-evaluation of patient's condition and review of old charts   Patient's presentation is most consistent with acute presentation with potential threat to life or bodily function.   MEDICATIONS ORDERED IN ED: Medications  diltiazem (CARDIZEM) 1 mg/mL load via infusion 10 mg (10 mg Intravenous Bolus from Bag 01/19/23 1510)    And  diltiazem (CARDIZEM) 125 mg in dextrose 5% 125 mL (1 mg/mL) infusion (5 mg/hr Intravenous New Bag/Given 01/19/23 1513)    FINAL CLINICAL IMPRESSION(S) / ED DIAGNOSES   Final diagnoses:  Atrial fibrillation with rapid ventricular response (HCC)     Rx / DC Orders   ED Discharge Orders     None        Note:  This document was prepared using Dragon voice recognition software and may include unintentional dictation errors.   Corena Herter, MD 01/19/23 1524

## 2023-01-19 NOTE — ED Notes (Signed)
Patient is being discharged from the Urgent Care and sent to the Emergency Department via POV . Per Dr.Jalandhara, patient is in need of higher level of care due to SVT. Patient is aware and verbalizes understanding of plan of care.  Vitals:   01/19/23 1220  BP: 95/72  Pulse: 86  Resp: 16  Temp: 98.2 F (36.8 C)  SpO2: 97%

## 2023-01-19 NOTE — H&P (Signed)
History and Physical    Neil Lopez ZOX:[id number] DOB: Jul 05, 1956 DOA: 01/19/2023  Referring MD/NP/PA:   PCP: [facility], [facility]   Patient coming from:  The patient is coming from home.     Chief Complaint: chest pain and heart racing  HPI: Neil Lopez is a 67 y.o. male with medical history significant of HTN, HLD, depression, obesity, OSA on CPAP, who presents with chest pain and heart racing.  Patient states that his chest pain started at about 7 PM yesterday. It is located in substernal area, mild, dull, 3 out of 10 in severity, burning-like pain, nonradiating.  Associated with palpitation and heart racing, no shortness of breath, cough, fever or chills.  No nausea, vomiting, diarrhea or abdominal pain.  No symptoms of UTI.  Patient states that he had back steroid injection last Monday.  Patient initially was seen in urgent care, found to have atrial fibrillation with RVR, heart rate up to 160, and sent to ED for further evaluation and treatment.  Patient was started on Cardizem drip, heart rate improved to 110s in ED. His chest pain has resolved completely.  Patient does not have recent fall or head injury.  No rectal bleeding or dark stool.  Per his wife, patient blood pressure was soft last night 90/50.  His amlodipine dose was recently increased from 5 mg to 10 mg daily.  Data reviewed independently and ED Course: pt was found to have troponin level 15, 18, 14,  TSH 7.315, WBC 9.7, potassium 3.4, BNP 328, GFR> 60, INR 1.0, PTT 27, temperature normal, blood pressure 118/86, RR 18, oxygen saturation 95% on room air with chest x-ray negative.  Patient is placed in PCU follow-up patient.  Dr. Duke Salvia of cardiology is consulted.  EKG: I have personally reviewed.  A-fib, heart rate 140, LAD, poor R progression, occasional PVC.  Review of Systems:   General: no fevers, chills, no body weight gain, fatigue HEENT: no blurry vision, hearing changes or sore  throat Respiratory: no dyspnea, coughing, wheezing CV: has chest pain, palpitations GI: no nausea, vomiting, abdominal pain, diarrhea, constipation GU: no dysuria, burning on urination, increased urinary frequency, hematuria  Ext: has trace leg edema Neuro: no unilateral weakness, numbness, or tingling, no vision change or hearing loss Skin: no rash, no skin tear. MSK: No muscle spasm, no deformity, no limitation of range of movement in spin Heme: No easy bruising.  Travel history: No recent long distant travel.   Allergy:  Allergies  Allergen Reactions   Cephalexin Rash   Pramipexole Other (See Comments)    Past Medical History:  Diagnosis Date   Essential (primary) hypertension     Past Surgical History:  Procedure Laterality Date   CHOLECYSTECTOMY     TONSILLECTOMY     VENTRAL HERNIA REPAIR      Social History:  reports that he has never smoked. He has never used smokeless tobacco. He reports that he does not currently use alcohol. He reports that he does not use drugs.  Family History:  Family History  Problem Relation Age of Onset   Atrial fibrillation Mother    Diabetes Father    Heart disease Father      Prior to Admission medications   Medication Sig Start Date End Date Taking? Authorizing Provider  amLODipine (NORVASC) 10 MG tablet Take 10 mg by mouth daily. 10/18/22  Yes [provider]  naproxen (NAPROSYN) 500 MG tablet Take 500 mg by mouth 2 (two) times daily with a meal.  08/19/13  Yes [provider]  amLODipine (NORVASC) 5 MG tablet Take 10 mg by mouth daily. 06/27/22   [provider]  cyclobenzaprine (FLEXERIL) 10 MG tablet Take by mouth. 08/19/14   [provider]  ondansetron (ZOFRAN-ODT) 4 MG disintegrating tablet Take 1-2 tablets (4-8 mg total) by mouth every 8 (eight) hours as needed. 10/24/22   Brimage, Seward Meth, DO  potassium chloride SA (KLOR-CON M) 20 MEQ tablet Take 2 tablets (40 mEq total) by mouth 2 (two) times  daily for 2 doses. 10/24/22 10/25/22  Katha Cabal, DO  SUMAtriptan (IMITREX) 50 MG tablet Take by mouth.    [provider]    Physical Exam: Vitals:   01/19/23 1800 01/19/23 1830 01/19/23 1856 01/19/23 1900  BP: 123/74 124/89  138/88  Pulse: 92 (!) 112  98  Resp: 19 (!) 24  18  Temp:   98.1 F (36.7 C)   TempSrc:   Oral   SpO2: 96% 92%  94%  Weight:      Height:       General: Not in acute distress HEENT:       Eyes: PERRL, EOMI, no jaundice       ENT: No discharge from the ears and nose, no pharynx injection, no tonsillar enlargement.        Neck: No JVD, no bruit, no mass felt. Heme: No neck lymph node enlargement. Cardiac: S1/S2, irregularly irregular rhythm, no murmurs, No gallops or rubs. Respiratory: No rales, wheezing, rhonchi or rubs. GI: Soft, nondistended, nontender, no rebound pain, no organomegaly, BS present. GU: No hematuria Ext: Has trace leg edema bilaterally. 1+DP/PT pulse bilaterally. Musculoskeletal: No joint deformities, No joint redness or warmth, no limitation of ROM in spin. Skin: No rashes.  Neuro: Alert, oriented X3, cranial nerves II-XII grossly intact, moves all extremities normally.  Psych: Patient is not psychotic, no suicidal or hemocidal ideation.  Labs on Admission: I have personally reviewed following labs and imaging studies  CBC: Recent Labs  Lab 01/19/23 1354  WBC 9.7  HGB 16.6  HCT 48.4  MCV 88.0  PLT 178   Basic Metabolic Panel: Recent Labs  Lab 01/19/23 1354  NA 135  K 3.4*  CL 102  CO2 25  GLUCOSE 122*  BUN 14  CREATININE 0.68  CALCIUM 9.1  MG 2.2   GFR: Estimated Creatinine Clearance: 111.1 mL/min (by C-G formula based on SCr of 0.68 mg/dL). Liver Function Tests: Recent Labs  Lab 01/19/23 1354  AST 20  ALT 25  ALKPHOS 73  BILITOT 1.2  PROT 7.6  ALBUMIN 4.9   No results for input(s): "LIPASE", "AMYLASE" in the last 168 hours. No results for input(s): "AMMONIA" in the last 168  hours. Coagulation Profile: Recent Labs  Lab 01/19/23 1354  INR 1.0   Cardiac Enzymes: No results for input(s): "CKTOTAL", "CKMB", "CKMBINDEX", "TROPONINI" in the last 168 hours. BNP (last 3 results) No results for input(s): "PROBNP" in the last 8760 hours. HbA1C: No results for input(s): "HGBA1C" in the last 72 hours. CBG: No results for input(s): "GLUCAP" in the last 168 hours. Lipid Profile: No results for input(s): "CHOL", "HDL", "LDLCALC", "TRIG", "CHOLHDL", "LDLDIRECT" in the last 72 hours. Thyroid Function Tests: Recent Labs    01/19/23 1354  TSH 7.315*   Anemia Panel: No results for input(s): "VITAMINB12", "FOLATE", "FERRITIN", "TIBC", "IRON", "RETICCTPCT" in the last 72 hours. Urine analysis: No results found for: "COLORURINE", "APPEARANCEUR", "LABSPEC", "PHURINE", "GLUCOSEU", "HGBUR", "BILIRUBINUR", "KETONESUR", "PROTEINUR", "UROBILINOGEN", "NITRITE", "LEUKOCYTESUR" Sepsis Labs: @  LABRCNTIP(procalcitonin:4,lacticidven:4) )No results found for this or any previous visit (from the past 240 hour(s)).   Radiological Exams on Admission: DG Chest 1 View  Result Date: 01/19/2023 CLINICAL DATA:  Chest pain and tachycardia. EXAM: CHEST  1 VIEW COMPARISON:  Chest radiographs 10/11/2014 FINDINGS: Cardiac pacer overlies the inferior left hemithorax. Cardiac silhouettew and mediastinal contours are within normal limits. The lungs are clear. No pleural effusion or pneumothorax. Bridging osteophytes of the lower thoracic spine. Moderate bilateral glenohumeral osteoarthritis. IMPRESSION: No active disease. Electronically Signed   By: Neita Garnet M.D.   On: 01/19/2023 15:31      Assessment/Plan Principal Problem:   Atrial fibrillation with RVR (HCC) Active Problems:   Chest pain   HTN (hypertension)   Hypokalemia   Depression   OSA on CPAP   Obesity (BMI 30-39.9)   Assessment and Plan:  Atrial fibrillation with RVR (HCC): HR is up to 160s, improving to 110s after started  Caridizem gtt. Consulted Dr. Duke Salvia of cardiology.  -Please in PCU for observation -Continue Cardizem drip -Check free T4 and free T3 (TSH elevated at 7.315) -Check UDS -f/u 2d echo  Chest pain: Chest pain is resolved.  Troponin level 15, 18, 14.  Likely due to demand ischemia. -Check UDS, A1c, FLP  HTN (hypertension) -Hold amlodipine due to soft blood pressure -Patient is on Cardizem drip  Hypokalemia: Potassium 3.4, magnesium 2.2 -Repleted potassium  Depression: Patient's not taking medications currently -Observe closely  OSA - on CPAP  Obesity (BMI 30-39.9): Body weight while 8.9 kg, BMI 32.55 -Encouraged losing weight, -Exercise and heart healthy diet      DVT ppx: on IV Heparin    Code Status: Full code   Family Communication:  Yes, patient's wife  at bed side.     Disposition Plan:  Anticipate discharge back to previous environment  Consults called:   Dr. Duke Salvia of cardiology is consulted.  Admission status and Level of care: Progressive:    for obs    Dispo: The patient is from: Home              Anticipated d/c is to: Home              Anticipated d/c date is: 1 day              Patient currently is not medically stable to d/c.    Severity of Illness:  The appropriate patient status for this patient is OBSERVATION. Observation status is judged to be reasonable and necessary in order to provide the required intensity of service to ensure the patient's safety. The patient's presenting symptoms, physical exam findings, and initial radiographic and laboratory data in the context of their medical condition is felt to place them at decreased risk for further clinical deterioration. Furthermore, it is anticipated that the patient will be medically stable for discharge from the hospital within 2 midnights of admission.        Date of Service 01/19/2023    Lorretta Harp [facility]   If 7PM-7AM, please contact  night-coverage [email or web address] 01/19/2023, 7:37 PM

## 2023-01-19 NOTE — ED Triage Notes (Signed)
Pt here with cp and tachycardia. Pt had a steroid injection last Mon in his back. Pt states pain is centered and does not radiate and is intermittent. Pt denies NVD.

## 2023-01-20 ENCOUNTER — Other Ambulatory Visit (HOSPITAL_COMMUNITY): Payer: Self-pay

## 2023-01-20 ENCOUNTER — Observation Stay (HOSPITAL_BASED_OUTPATIENT_CLINIC_OR_DEPARTMENT_OTHER)
Admit: 2023-01-20 | Discharge: 2023-01-20 | Disposition: A | Discharge status: Home / Self Care | Payer: No Typology Code available for payment source | Attending: Internal Medicine | Admitting: Internal Medicine

## 2023-01-20 DIAGNOSIS — I4891 Unspecified atrial fibrillation: Principal | ICD-10-CM

## 2023-01-20 LAB — LIPID PANEL
Cholesterol: 163 mg/dL (ref 0–200)
HDL: 46 mg/dL (ref >40)
LDL Cholesterol: 98 mg/dL (ref 0–99)
Total CHOL/HDL Ratio: 3.5 RATIO
Triglycerides: 97 mg/dL (ref <150)
VLDL: 19 mg/dL (ref 0–40)

## 2023-01-20 LAB — ECHOCARDIOGRAM COMPLETE
AR max vel: 2.57 cm2
AV Area VTI: 3 cm2
AV Area mean vel: 2.82 cm2
AV Mean grad: 1 mmHg
AV Peak grad: 2.8 mmHg
Ao pk vel: 0.83 m/s
Area-P 1/2: 2.82 cm2
Height: 72 in
MV VTI: 1.81 cm2
S' Lateral: 2.6 cm
Weight: 3840 oz

## 2023-01-20 LAB — T4, FREE: Free T4: 1.13 ng/dL — ABNORMAL HIGH (ref 0.61–1.12)

## 2023-01-20 LAB — CBC
HCT: 42.6 % (ref 39.0–52.0)
Hemoglobin: 15.2 g/dL (ref 13.0–17.0)
MCH: 30.8 pg (ref 26.0–34.0)
MCHC: 35.7 g/dL (ref 30.0–36.0)
MCV: 86.4 fL (ref 80.0–100.0)
Platelets: 148 10*3/uL — ABNORMAL LOW (ref 150–400)
RBC: 4.93 MIL/uL (ref 4.22–5.81)
RDW: 13.3 % (ref 11.5–15.5)
WBC: 9.2 10*3/uL (ref 4.0–10.5)
nRBC: 0 % (ref 0.0–0.2)

## 2023-01-20 LAB — HEPARIN LEVEL (UNFRACTIONATED): Heparin Unfractionated: 0.4 IU/mL (ref 0.30–0.70)

## 2023-01-20 LAB — BASIC METABOLIC PANEL
Anion gap: 7 (ref 5–15)
BUN: 13 mg/dL (ref 8–23)
CO2: 23 mmol/L (ref 22–32)
Calcium: 8.8 mg/dL — ABNORMAL LOW (ref 8.9–10.3)
Chloride: 106 mmol/L (ref 98–111)
Creatinine, Ser: 0.63 mg/dL (ref 0.61–1.24)
GFR, Estimated: >60 mL/min (ref >60)
Glucose, Bld: 139 mg/dL — ABNORMAL HIGH (ref 70–99)
Potassium: 3.9 mmol/L (ref 3.5–5.1)
Sodium: 136 mmol/L (ref 135–145)

## 2023-01-20 LAB — HIV ANTIBODY (ROUTINE TESTING W REFLEX): HIV Screen 4th Generation wRfx: NONREACTIVE

## 2023-01-20 MED ORDER — DILTIAZEM HCL 30 MG PO TABS
90.0000 mg | ORAL_TABLET | Freq: Four times a day (QID) | ORAL | Status: DC
  Administered 2023-01-20 – 2023-01-21 (×5): 90 mg via ORAL
  Filled 2023-01-20 (×4): qty 3
  Filled 2023-01-20: qty 2

## 2023-01-20 MED ORDER — APIXABAN 5 MG PO TABS
5.0000 mg | ORAL_TABLET | Freq: Two times a day (BID) | ORAL | Status: DC
  Administered 2023-01-20 – 2023-01-21 (×3): 5 mg via ORAL
  Filled 2023-01-20 (×3): qty 1

## 2023-01-20 NOTE — TOC Benefit Eligibility Note (Signed)
Pharmacy Patient Advocate Encounter  Insurance verification completed.    The patient is insured through [employer]   Ran test claim for Eliquis 5 mg and the current 30 day co-pay is $96.67.  Ran test claim for Xarelto 20 mg and the current 30 day co-pay is $72.03.   This test claim was processed through [facility]- copay amounts may vary at other pharmacies due to pharmacy/plan contracts, or as the patient moves through the different stages of their insurance plan.    Jorryn Casagrande, CPHT Pharmacy Patient Advocate Specialist [facility] Patient Advocate Team Direct Number: [phone]  Fax: [phone] 

## 2023-01-20 NOTE — Consult Note (Signed)
Cardiology Consultation   Patient ID: Neil Lopez MRN: [id number]; DOB: 1956/01/13  Admit date: 01/19/2023 Date of Consult: 01/20/2023  PCP:  Administration, [facility] Providers Cardiologist:  None      New consult completed by Dr End  Patient Profile:   Neil Lopez is a 67 y.o. male with a hx of hypertension, hyperlipidemia, depression, obesity, obstructive sleep apnea on CPAP who is being seen 01/20/2023 for the evaluation of chest pain and atrial fibrillation RVR at the request of Dr. Clyde Lundborg.  History of Present Illness:   Neil Lopez presented to the [facility] urgent care on 01/19/2023 with complaint of chest pain, headache, fast heart rate and low blood pressure that started Wednesday evening at home.  He had previously had an epidural injection into his back on Monday and was feeling in his normal state of health prior to Wednesday.  He typically takes his blood pressure 1-2 times a week at home since being diagnosed with hypertension and taking amlodipine.  On Thursday morning when he woke he still lower blood pressure that improved but still continued to have elevated heart rate.  He had taken his wife to a doctor's appointment  He had even called Dr. Marden Noble office to see if his epidural injection could have caused some of these and he was advised by them that he needed to be evaluated by urgent care. He had started developing sudden onset chest discomfort and his wife and felt his pulse at  his wrist to calculate his pulse (who is retired Engineer, civil (consulting)) and noted it was abnormal and that he needed to be evaluated.  With EKG read as SVT with a rate of 137 he was advised he needed further follow-up in the emergency department in order to be taken to the [facility] by private vehicle.  She presented to the [facility] emergency department on 01/19/2023.  He was found to be in atrial fibrillation with RVR with no previous history of A-fib.  He denies any  previous cardiac history.  His chest pain had resolved at that time as well.  He had previously only been on amlodipine for high blood pressure and had been treated for chronic back pain with recent epidural injection.  States that approximately 2 years ago that he did have a exercise was stress test that was done and then underwent a nuclear stress test and both were unrevealing.  Initial vital signs: Blood pressure 118/86, heart rate of 140, respirations of 18, temperature 98.3  Pertinent labs: BMP 328.2, high-sensitivity troponin 15, 18, 14, urine drug screen positive for cannabinoid, blood glucose 139, calcium 8.8  Imaging: Chest x-ray revealed no active disease  Medications administered in the emergency department: Diltiazem bolus 10 mg IVP, diltiazem drip titrated to 15 mg/h, and heparin infusion  Cardiology was consulted for complaint of chest pain and atrial fibrillation RVR   Past Medical History:  Diagnosis Date   Essential (primary) hypertension     Past Surgical History:  Procedure Laterality Date   CHOLECYSTECTOMY     TONSILLECTOMY     VENTRAL HERNIA REPAIR       Home Medications:  Prior to Admission medications   Medication Sig Start Date End Date Taking? Authorizing Provider  amLODipine (NORVASC) 10 MG tablet Take 10 mg by mouth daily. 10/18/22  Yes [provider]  cyclobenzaprine (FLEXERIL) 10 MG tablet Take 10 mg by mouth at bedtime. 08/19/14  Yes [provider]  naproxen (NAPROSYN) 500 MG  tablet Take 500 mg by mouth 2 (two) times daily with a meal. 08/19/13  Yes [provider]  pantoprazole (PROTONIX) 40 MG tablet Take 40 mg by mouth daily. 10/03/22  Yes [provider]  amLODipine (NORVASC) 5 MG tablet Take 10 mg by mouth daily. Patient not taking: Reported on 01/19/2023 06/27/22   [provider]  ondansetron (ZOFRAN-ODT) 4 MG disintegrating tablet Take 1-2 tablets (4-8 mg total) by mouth every 8 (eight) hours as  needed. Patient not taking: Reported on 01/19/2023 10/24/22   Katha Cabal, DO  potassium chloride SA (KLOR-CON M) 20 MEQ tablet Take 2 tablets (40 mEq total) by mouth 2 (two) times daily for 2 doses. 10/24/22 10/25/22  Katha Cabal, DO  SUMAtriptan (IMITREX) 50 MG tablet Take by mouth. Patient not taking: Reported on 01/19/2023    [provider]    Inpatient Medications: Scheduled Meds:  pantoprazole  40 mg Oral Daily   Continuous Infusions:  diltiazem (CARDIZEM) infusion 15 mg/hr (01/20/23 0702)   heparin 1,400 Units/hr (01/20/23 0519)   PRN Meds: acetaminophen, hydrALAZINE, ondansetron (ZOFRAN) IV  Allergies:    Allergies  Allergen Reactions   Cephalexin Rash   Pramipexole Other (See Comments)    Social History:   Social History   Socioeconomic History   Marital status: Married    Spouse name: Not on file   Number of children: Not on file   Years of education: Not on file   Highest education level: Not on file  Occupational History   Not on file  Tobacco Use   Smoking status: Never   Smokeless tobacco: Never  Vaping Use   Vaping status: Never Used  Substance and Sexual Activity   Alcohol use: Not Currently   Drug use: Never   Sexual activity: Not on file  Other Topics Concern   Not on file  Social History Narrative   Not on file   Social Determinants of Health   Financial Resource Strain: Not on file  Food Insecurity: No Food Insecurity (01/20/2023)   Hunger Vital Sign    Worried About Running Out of Food in the Last Year: Never true    Ran Out of Food in the Last Year: Never true  Transportation Needs: No Transportation Needs (01/20/2023)   PRAPARE - Administrator, Civil Service (Medical): No    Lack of Transportation (Non-Medical): No  Physical Activity: Not on file  Stress: Not on file  Social Connections: Not on file  Intimate Partner Violence: Not At Risk (01/20/2023)   Humiliation, Afraid, Rape, and Kick questionnaire     Fear of Current or Ex-Partner: No    Emotionally Abused: No    Physically Abused: No    Sexually Abused: No    Family History:    Family History  Problem Relation Age of Onset   Atrial fibrillation Mother    Diabetes Father    Heart disease Father      ROS:  Please see the history of present illness.  Review of Systems  Cardiovascular:  Positive for chest pain and palpitations.  Musculoskeletal:  Positive for back pain.    All other ROS reviewed and negative.     Physical Exam/Data:   Vitals:   01/20/23 0730 01/20/23 0830 01/20/23 1000 01/20/23 1030  BP: 121/71 126/72 125/70 116/72  Pulse: 71 87 89 (!) 34  Resp: (!) 24 (!) 23 (!) 24 (!) 23  Temp:      TempSrc:  SpO2: 94% 95% 94% 94%  Weight:      Height:        Intake/Output Summary (Last 24 hours) at 01/20/2023 1041 Last data filed at 01/20/2023 0519 Gross per 24 hour  Intake 239.88 ml  Output 500 ml  Net -260.12 ml      01/19/2023    3:16 PM 01/19/2023   12:18 PM  Last 3 Weights  Weight (lbs) 240 lb 240 lb  Weight (kg) 108.863 kg 108.863 kg     Body mass index is 32.55 kg/m.  General:  Well nourished, well developed, in no acute distress HEENT: normal Neck: no JVD Vascular: No carotid bruits; Distal pulses 2+ bilaterally Cardiac:  normal S1, S2; IR IR; no murmur  Lungs:  clear to auscultation bilaterally, no wheezing, rhonchi or rales  Abd: soft, nontender, obese, no hepatomegaly  Ext: no edema Musculoskeletal:  No deformities, BUE and BLE strength normal and equal Skin: warm and dry  Neuro:  CNs 2-12 intact, no focal abnormalities noted Psych:  Normal affect   EKG:  The EKG was personally reviewed and demonstrates:  atrial fibrillation rate of 140 with aberrancy  Telemetry:  Telemetry was personally reviewed and demonstrates:  atrial fibrillation that is rate controlled 80-100  Relevant CV Studies: Echocardiogram ordered and pending  Laboratory Data:  High Sensitivity Troponin:   Recent  Labs  Lab 01/19/23 1242 01/19/23 1354 01/19/23 1610  TROPONINIHS 15 18* 14     Chemistry Recent Labs  Lab 01/19/23 1354 01/20/23 0550  NA 135 136  K 3.4* 3.9  CL 102 106  CO2 25 23  GLUCOSE 122* 139*  BUN 14 13  CREATININE 0.68 0.63  CALCIUM 9.1 8.8*  MG 2.2  --   GFRNONAA >60 >60  ANIONGAP 8 7    Recent Labs  Lab 01/19/23 1354  PROT 7.6  ALBUMIN 4.9  AST 20  ALT 25  ALKPHOS 73  BILITOT 1.2   Lipids  Recent Labs  Lab 01/20/23 0550  CHOL 163  TRIG 97  HDL 46  LDLCALC 98  CHOLHDL 3.5    Hematology Recent Labs  Lab 01/19/23 1354 01/20/23 0702  WBC 9.7 9.2  RBC 5.50 4.93  HGB 16.6 15.2  HCT 48.4 42.6  MCV 88.0 86.4  MCH 30.2 30.8  MCHC 34.3 35.7  RDW 13.4 13.3  PLT 178 148*   Thyroid  Recent Labs  Lab 01/19/23 1354 01/20/23 0550  TSH 7.315*  --   FREET4  --  1.13*    BNP Recent Labs  Lab 01/19/23 1354  BNP 328.2*    DDimer No results for input(s): "DDIMER" in the last 168 hours.   Radiology/Studies:  DG Chest 1 View  Result Date: 01/19/2023 CLINICAL DATA:  Chest pain and tachycardia. EXAM: CHEST  1 VIEW COMPARISON:  Chest radiographs 10/11/2014 FINDINGS: Cardiac pacer overlies the inferior left hemithorax. Cardiac silhouettew and mediastinal contours are within normal limits. The lungs are clear. No pleural effusion or pneumothorax. Bridging osteophytes of the lower thoracic spine. Moderate bilateral glenohumeral osteoarthritis. IMPRESSION: No active disease. Electronically Signed   By: Neita Garnet M.D.   On: 01/19/2023 15:31     Assessment and Plan:   New onset artrial fibrillation with RVR -patient started with symptoms on Wednesday 01/18/23 -remains on diltiazem drip at 15 mg/hr, will work to transition to oral diltiazem 90 mg every 6 hours, after first dose stop diltiazem drip -continued on heparin infusion, will reach out to [facility] provider Dr  Mariah Milling since he had recent epidural infusion about starting OAC, will transition prior to  D/C -will likely start on Eliquis 5 mg bid for CHA2DS2-VASc Score = 2 for stroke prophylaxis -echocardiogram ordered and pending with recommendations to follow -will need to remain on OAC uninterrupted for 3-4 weeks, then if he remains in atrial fibrillation he can be scheduled for a DCCV -continue with telemetry monitoring -TSH 7.315, free t4 1.13  -heart rates are better controlled 70-90 bpm  Chest pain -chest pain free on exam -high sensitivity troponins negative  -No ischemic changes noted on telemetry -Patient states that he had a stress test at the [place] about 2 years prior that was a normal test -No further ischemic work-up warranted at this time  Hypertension -blood pressure 108/79 -continued on diltiazem -would continue to hold PTA amlodipine -vital signs per unit protocol   OSA -patient states that he is compliant -continue with nightly CP usage  Obesity -encouraged to continue with walking 2 miles per day -decrease caloric intake with increasing activity   Risk Assessment/Risk Scores:          CHA2DS2-VASc Score = 2   This indicates a 2.2% annual risk of stroke. The patient's score is based upon: CHF History: 0 HTN History: 1 Diabetes History: 0 Stroke History: 0 Vascular Disease History: 0 Age Score: 1 Gender Score: 0         For questions or updates, please contact [facility] Please consult [email or web address] for contact info under    Signed, Dhanvi Boesen, NP  01/20/2023 10:41 AM

## 2023-01-20 NOTE — Progress Notes (Signed)
ANTICOAGULATION CONSULT NOTE  Pharmacy Consult for Heparin Drip Indication: atrial fibrillation  Allergies  Allergen Reactions   Cephalexin Rash   Pramipexole Other (See Comments)    Patient Measurements: Height: 6' (182.9 cm) Weight: 108.9 kg (240 lb) IBW/kg (Calculated) : 77.6 Heparin Dosing Weight: 100.6 kg  Vital Signs: Temp: 97.6 F (36.4 C) (07/11 2338) Temp Source: Oral (07/11 2338) BP: 121/67 (07/11 2230) Pulse Rate: 85 (07/11 2230)  Labs: Recent Labs    01/19/23 1242 01/19/23 1354 01/19/23 1610 01/19/23 2328  HGB  --  16.6  --   --   HCT  --  48.4  --   --   PLT  --  178  --   --   APTT  --  27  --   --   LABPROT  --  13.5  --   --   INR  --  1.0  --   --   HEPARINUNFRC  --   --   --  0.41  CREATININE  --  0.68  --   --   TROPONINIHS 15 18* 14  --     Estimated Creatinine Clearance: 111.1 mL/min (by C-G formula based on SCr of 0.68 mg/dL).   Medical History: Past Medical History:  Diagnosis Date   Essential (primary) hypertension    Assessment: Patient is a 67yo male admitted for new onset afib. Pharmacy consulted for Heparin dosing. Review of outpatient medication history does not reveal any anticoagulant use.  Goal of Therapy:  Heparin level 0.3-0.7 units/ml Monitor platelets by anticoagulation protocol: Yes   07/11 2328 HL 0.41, therapeutic x 1  Plan:  Continue heparin infusion at 1400 units/hr Recheck HL w/ AM labs to confirm CBC daily while on heparin  Otelia Sergeant, PharmD, [facility] 01/20/2023 12:14 AM

## 2023-01-20 NOTE — Progress Notes (Signed)
ANTICOAGULATION CONSULT NOTE  Pharmacy Consult for Heparin Drip Indication: atrial fibrillation  Patient Measurements: Height: 6' (182.9 cm) Weight: 108.9 kg (240 lb) IBW/kg (Calculated) : 77.6 Heparin Dosing Weight: 100.6 kg  Labs: Recent Labs    01/19/23 1242 01/19/23 1354 01/19/23 1610 01/19/23 2328 01/20/23 0550 01/20/23 0702  HGB  --  16.6  --   --   --  15.2  HCT  --  48.4  --   --   --  42.6  PLT  --  178  --   --   --  148*  APTT  --  27  --   --   --   --   LABPROT  --  13.5  --   --   --   --   INR  --  1.0  --   --   --   --   HEPARINUNFRC  --   --   --  0.41 0.40  --   CREATININE  --  0.68  --   --  0.63  --   TROPONINIHS 15 18* 14  --   --   --     Estimated Creatinine Clearance: 111.1 mL/min (by C-G formula based on SCr of 0.63 mg/dL).   Medical History: Past Medical History:  Diagnosis Date   Essential (primary) hypertension    Assessment: Patient is a 67 yo male admitted for new onset afib. Pharmacy consulted for Heparin dosing. Review of outpatient medication history does not reveal any anticoagulant use.  Goal of Therapy:  Heparin level 0.3-0.7 units/ml Monitor platelets by anticoagulation protocol: Yes   07/11 2328 HL 0.41, therapeutic x 1 07/12 0550 HL 0.4, therapeutic x 2  Plan:  Continue heparin infusion at 1400 units/hr Recheck HL w/ AM labs CBC daily while on heparin  Tressie Ellis 01/20/2023 9:39 AM

## 2023-01-20 NOTE — Progress Notes (Signed)
ANTICOAGULATION CONSULT NOTE  Pharmacy Consult for Apixaban Indication: atrial fibrillation  Patient Measurements: Height: 6' (182.9 cm) Weight: 108.9 kg (240 lb) IBW/kg (Calculated) : 77.6  Labs: Recent Labs    01/19/23 1242 01/19/23 1354 01/19/23 1610 01/19/23 2328 01/20/23 0550 01/20/23 0702  HGB  --  16.6  --   --   --  15.2  HCT  --  48.4  --   --   --  42.6  PLT  --  178  --   --   --  148*  APTT  --  27  --   --   --   --   LABPROT  --  13.5  --   --   --   --   INR  --  1.0  --   --   --   --   HEPARINUNFRC  --   --   --  0.41 0.40  --   CREATININE  --  0.68  --   --  0.63  --   TROPONINIHS 15 18* 14  --   --   --     Estimated Creatinine Clearance: 111.1 mL/min (by C-G formula based on SCr of 0.63 mg/dL).   Medical History: Past Medical History:  Diagnosis Date   Essential (primary) hypertension    Assessment: Patient is a 67 yo male admitted for new onset afib. Review of outpatient medication history does not reveal any anticoagulant use. Pharmacy consulted to transition heparin infusion to apixaban.  Plan:  --Discontinue heparin infusion --Start apixaban 5 mg BID --CBC per protocol while inpatient  Tressie Ellis 01/20/2023 3:29 PM

## 2023-01-20 NOTE — Progress Notes (Addendum)
PROGRESS NOTE    Neil Lopez  WUJ:[id number] DOB: 23-Nov-1955 DOA: 01/19/2023 [facility]   Assessment & Plan:   Principal Problem:   Atrial fibrillation with RVR (HCC) Active Problems:   Chest pain   HTN (hypertension)   Hypokalemia   Depression   OSA on CPAP   Obesity (BMI 30-39.9)  Assessment and Plan: A. fib: w/ RVR.  New onset. D/C IV dilt drip and start po diltiazem. Continue on IV heparin drip. Echo ordered. Continue on tele. Cardio following and recs apprec    Chest pain: likely secondary to demand ischemia in setting of a. fib w/ RVR  HTN: holding home dose of amlodipine. Continue on IV diltiazem drip    Hypokalemia: WNL today    Depression: severity unknown. Not taking any meds for this as per med rec    OSA: CPAP qhs   Obesity: BMI 32.5. Would benefit from weight loss     DVT prophylaxis: heparin  Code Status: full  Family Communication: discussed pt's care w/ pt's wife, Eunice Blase, and answered her questions  Disposition Plan: likely d/c back home   Level of care: Progressive  Status is: Observation The patient remains OBS appropriate and will d/c before 2 midnights.    Consultants:  Cardio   Procedures:   Antimicrobials:   Subjective: Pt c/o fatigue   Objective: Vitals:   01/20/23 0500 01/20/23 0530 01/20/23 0700 01/20/23 0730  BP: 122/82 119/78 121/80 121/71  Pulse: 81 84 82 71  Resp: (!) 26 (!) 21 (!) 24 (!) 24  Temp:  97.8 F (36.6 C)    TempSrc:  Oral    SpO2: 96% 93% 93% 94%  Weight:      Height:        Intake/Output Summary (Last 24 hours) at 01/20/2023 0820 Last data filed at 01/20/2023 0519 Gross per 24 hour  Intake 239.88 ml  Output 500 ml  Net -260.12 ml   Filed Weights   01/19/23 1516  Weight: 108.9 kg    Examination:  General exam: Appears calm and comfortable  Respiratory system: Clear to auscultation. Respiratory effort normal. Cardiovascular system: irregularly irregular. No  rubs, gallops or clicks. Gastrointestinal system: Abdomen is obese, soft and nontender. Normal bowel sounds heard. Central nervous system: Alert and oriented. Moves all extremities  Psychiatry: Judgement and insight appear normal. Mood & affect appropriate.     Data Reviewed: I have personally reviewed following labs and imaging studies  CBC: Recent Labs  Lab 01/19/23 1354 01/20/23 0702  WBC 9.7 9.2  HGB 16.6 15.2  HCT 48.4 42.6  MCV 88.0 86.4  PLT 178 148*   Basic Metabolic Panel: Recent Labs  Lab 01/19/23 1354 01/20/23 0550  NA 135 136  K 3.4* 3.9  CL 102 106  CO2 25 23  GLUCOSE 122* 139*  BUN 14 13  CREATININE 0.68 0.63  CALCIUM 9.1 8.8*  MG 2.2  --    GFR: Estimated Creatinine Clearance: 111.1 mL/min (by C-G formula based on SCr of 0.63 mg/dL). Liver Function Tests: Recent Labs  Lab 01/19/23 1354  AST 20  ALT 25  ALKPHOS 73  BILITOT 1.2  PROT 7.6  ALBUMIN 4.9   No results for input(s): "LIPASE", "AMYLASE" in the last 168 hours. No results for input(s): "AMMONIA" in the last 168 hours. Coagulation Profile: Recent Labs  Lab 01/19/23 1354  INR 1.0   Cardiac Enzymes: No results for input(s): "CKTOTAL", "CKMB", "CKMBINDEX", "TROPONINI" in the last 168 hours. BNP (last  3 results) No results for input(s): "PROBNP" in the last 8760 hours. HbA1C: No results for input(s): "HGBA1C" in the last 72 hours. CBG: No results for input(s): "GLUCAP" in the last 168 hours. Lipid Profile: Recent Labs    01/20/23 0550  CHOL 163  HDL 46  LDLCALC 98  TRIG 97  CHOLHDL 3.5   Thyroid Function Tests: Recent Labs    01/19/23 1354 01/20/23 0550  TSH 7.315*  --   FREET4  --  1.13*   Anemia Panel: No results for input(s): "VITAMINB12", "FOLATE", "FERRITIN", "TIBC", "IRON", "RETICCTPCT" in the last 72 hours. Sepsis Labs: No results for input(s): "PROCALCITON", "LATICACIDVEN" in the last 168 hours.  No results found for this or any previous visit (from the  past 240 hour(s)).       Radiology Studies: DG Chest 1 View  Result Date: 01/19/2023 CLINICAL DATA:  Chest pain and tachycardia. EXAM: CHEST  1 VIEW COMPARISON:  Chest radiographs 10/11/2014 FINDINGS: Cardiac pacer overlies the inferior left hemithorax. Cardiac silhouettew and mediastinal contours are within normal limits. The lungs are clear. No pleural effusion or pneumothorax. Bridging osteophytes of the lower thoracic spine. Moderate bilateral glenohumeral osteoarthritis. IMPRESSION: No active disease. Electronically Signed   By: Neita Garnet M.D.   On: 01/19/2023 15:31        Scheduled Meds:  pantoprazole  40 mg Oral Daily   Continuous Infusions:  diltiazem (CARDIZEM) infusion 15 mg/hr (01/20/23 0702)   heparin 1,400 Units/hr (01/20/23 0519)     LOS: 0 days    Time spent: 35 mins     Charise Killian, MD [facility] Pager [phone]  If 7PM-7AM, please contact night-coverage [email or web address] 01/20/2023, 8:20 AM

## 2023-01-20 NOTE — Progress Notes (Signed)
*  PRELIMINARY RESULTS* Echocardiogram 2D Echocardiogram has been performed.  Cristela Blue 01/20/2023, 9:14 AM

## 2023-01-21 DIAGNOSIS — I4891 Unspecified atrial fibrillation: Secondary | ICD-10-CM | POA: Diagnosis not present

## 2023-01-21 LAB — BASIC METABOLIC PANEL
Anion gap: 7 (ref 5–15)
BUN: 12 mg/dL (ref 8–23)
CO2: 24 mmol/L (ref 22–32)
Calcium: 8.9 mg/dL (ref 8.9–10.3)
Chloride: 106 mmol/L (ref 98–111)
Creatinine, Ser: 0.67 mg/dL (ref 0.61–1.24)
GFR, Estimated: >60 mL/min (ref >60)
Glucose, Bld: 146 mg/dL — ABNORMAL HIGH (ref 70–99)
Potassium: 3.7 mmol/L (ref 3.5–5.1)
Sodium: 137 mmol/L (ref 135–145)

## 2023-01-21 LAB — CBC
HCT: 42.7 % (ref 39.0–52.0)
Hemoglobin: 15.3 g/dL (ref 13.0–17.0)
MCH: 30.6 pg (ref 26.0–34.0)
MCHC: 35.8 g/dL (ref 30.0–36.0)
MCV: 85.4 fL (ref 80.0–100.0)
Platelets: 157 10*3/uL (ref 150–400)
RBC: 5 MIL/uL (ref 4.22–5.81)
RDW: 13.1 % (ref 11.5–15.5)
WBC: 8.3 10*3/uL (ref 4.0–10.5)
nRBC: 0 % (ref 0.0–0.2)

## 2023-01-21 LAB — HEMOGLOBIN A1C
Hgb A1c MFr Bld: 6.1 % — ABNORMAL HIGH (ref 4.8–5.6)
Mean Plasma Glucose: 128 mg/dL

## 2023-01-21 MED ORDER — DILTIAZEM HCL ER COATED BEADS 180 MG PO CP24: 360.0000 mg | ORAL_CAPSULE | Freq: Every day | ORAL | Status: DC

## 2023-01-21 MED ORDER — DILTIAZEM HCL ER COATED BEADS 360 MG PO CP24: 360.0000 mg | ORAL_CAPSULE | Freq: Every day | ORAL | 0 refills | Status: AC

## 2023-01-21 MED ORDER — APIXABAN 5 MG PO TABS: 5.0000 mg | ORAL_TABLET | Freq: Two times a day (BID) | ORAL | 0 refills | Status: AC

## 2023-01-21 NOTE — Discharge Summary (Signed)
Physician Discharge Summary  Neil Lopez WUJ:[id number] DOB: April 13, 1956 DOA: 01/19/2023  [facility]  Admit date: 01/19/2023 Discharge date: 01/21/2023  Admitted From: home  Disposition:  home   Recommendations for Outpatient Follow-up:  Follow up with PCP in 1-2 weeks F/u w/ cardio, Dr. Okey Dupre, in 2-4 weeks   Home Health: no  Equipment/Devices:  Discharge Condition: stable  CODE STATUS: full  Diet recommendation: Heart Healthy   Brief/Interim Summary: HPI was taken from Dr. Clyde Lundborg: Neil Lopez is a 67 y.o. male with medical history significant of HTN, HLD, depression, obesity, OSA on CPAP, who presents with chest pain and heart racing.   Patient states that his chest pain started at about 7 PM yesterday. It is located in substernal area, mild, dull, 3 out of 10 in severity, burning-like pain, nonradiating.  Associated with palpitation and heart racing, no shortness of breath, cough, fever or chills.  No nausea, vomiting, diarrhea or abdominal pain.  No symptoms of UTI.  Patient states that he had back steroid injection last Monday.  Patient initially was seen in urgent care, found to have atrial fibrillation with RVR, heart rate up to 160, and sent to ED for further evaluation and treatment.  Patient was started on Cardizem drip, heart rate improved to 110s in ED. His chest pain has resolved completely.  Patient does not have recent fall or head injury.  No rectal bleeding or dark stool.  Per his wife, patient blood pressure was soft last night 90/50.  His amlodipine dose was recently increased from 5 mg to 10 mg daily.   Data reviewed independently and ED Course: pt was found to have troponin level 15, 18, 14,  TSH 7.315, WBC 9.7, potassium 3.4, BNP 328, GFR> 60, INR 1.0, PTT 27, temperature normal, blood pressure 118/86, RR 18, oxygen saturation 95% on room air with chest x-ray negative.  Patient is placed in PCU follow-up patient.  Dr. Duke Salvia of  cardiology is consulted.  Discharge Diagnoses:  Principal Problem:   Atrial fibrillation with RVR (HCC) Active Problems:   Chest pain   HTN (hypertension)   Hypokalemia   Depression   OSA on CPAP   Obesity (BMI 30-39.9)  A. fib: w/ RVR.  New onset. D/C IV dilt drip and continue on po diltiazem. D/c IV heparin and started on eliquis. Continue on tele. Cardio following and recs apprec    Chest pain: likely secondary to demand ischemia in setting of a. fib w/ RVR  HTN: continue on po diltiazem. D/c amlodipine    Hypokalemia: WNL today    Depression: severity unknown. Not taking any meds for this as per med rec    OSA: CPAP qhs   Obesity: BMI 32.5. Would benefit from weight loss   Discharge Instructions  Discharge Instructions     Diet - low sodium heart healthy   Complete by: As directed    Discharge instructions   Complete by: As directed    F/u w/ PCP in 1-2 weeks. F/u w/ cardio, Dr. Okey Dupre, in 2-4 weeks   Increase activity slowly   Complete by: As directed       Allergies as of 01/21/2023       Reactions   Cephalexin Rash   Pramipexole Other (See Comments)        Medication List     STOP taking these medications    amLODipine 10 MG tablet Commonly known as: NORVASC   amLODipine 5 MG tablet Commonly known as: NORVASC  naproxen 500 MG tablet Commonly known as: NAPROSYN       TAKE these medications    apixaban 5 MG Tabs tablet Commonly known as: ELIQUIS Take 1 tablet (5 mg total) by mouth 2 (two) times daily.   cyclobenzaprine 10 MG tablet Commonly known as: FLEXERIL Take 10 mg by mouth at bedtime.   diltiazem 360 MG 24 hr capsule Commonly known as: CARDIZEM CD Take 1 capsule (360 mg total) by mouth daily. Start taking on: January 22, 2023   ondansetron 4 MG disintegrating tablet Commonly known as: ZOFRAN-ODT Take 1-2 tablets (4-8 mg total) by mouth every 8 (eight) hours as needed.   pantoprazole 40 MG tablet Commonly known as:  PROTONIX Take 40 mg by mouth daily.   potassium chloride SA 20 MEQ tablet Commonly known as: KLOR-CON M Take 2 tablets (40 mEq total) by mouth 2 (two) times daily for 2 doses.   SUMAtriptan 50 MG tablet Commonly known as: IMITREX Take by mouth.        Allergies  Allergen Reactions   Cephalexin Rash   Pramipexole Other (See Comments)    Consultations: Cardio    Procedures/Studies: ECHOCARDIOGRAM COMPLETE  Result Date: 01/20/2023    ECHOCARDIOGRAM REPORT   Patient Name:   Neil Lopez Date of Exam: 01/20/2023 Medical Rec #:  [id number]                Height:       72.0 in Accession #:    [id number]               Weight:       240.0 lb Date of Birth:  10-21-1955                BSA:          2.302 m Patient Age:    67 years                 BP:           119/78 mmHg Patient Gender: M                        HR:           84 bpm. Exam Location:  [facility] Procedure: 2D Echo, Cardiac Doppler and Color Doppler Indications:     Atrial Fibrillation I48.91  History:         Patient has no prior history of Echocardiogram examinations.                  Risk Factors:Hypertension.  Sonographer:     Cristela Blue Referring Phys:  [id number] Brien Few NIU Diagnosing Phys: Debbe Odea MD IMPRESSIONS  1. Left ventricular ejection fraction, by estimation, is 60 to 65%. The left ventricle has normal function. The left ventricle has no regional wall motion abnormalities. There is mild left ventricular hypertrophy. Left ventricular diastolic parameters are indeterminate.  2. Right ventricular systolic function is low normal. The right ventricular size is mildly enlarged.  3. Right atrial size was mildly dilated.  4. The mitral valve is normal in structure. No evidence of mitral valve regurgitation.  5. The aortic valve is tricuspid. Aortic valve regurgitation is not visualized. FINDINGS  Left Ventricle: Left ventricular ejection fraction, by estimation, is 60 to 65%. The left ventricle has normal function. The  left ventricle has no regional wall motion abnormalities. The left ventricular internal cavity size was normal in size. There is  mild  left ventricular hypertrophy. Left ventricular diastolic parameters are indeterminate. Right Ventricle: The right ventricular size is mildly enlarged. No increase in right ventricular wall thickness. Right ventricular systolic function is low normal. Left Atrium: Left atrial size was normal in size. Right Atrium: Right atrial size was mildly dilated. Pericardium: There is no evidence of pericardial effusion. Mitral Valve: The mitral valve is normal in structure. No evidence of mitral valve regurgitation. MV peak gradient, 3.9 mmHg. The mean mitral valve gradient is 2.0 mmHg. Tricuspid Valve: The tricuspid valve is normal in structure. Tricuspid valve regurgitation is mild. Aortic Valve: The aortic valve is tricuspid. Aortic valve regurgitation is not visualized. Aortic valve mean gradient measures 1.0 mmHg. Aortic valve peak gradient measures 2.8 mmHg. Aortic valve area, by VTI measures 3.00 cm. Pulmonic Valve: The pulmonic valve was not well visualized. Pulmonic valve regurgitation is not visualized. Aorta: The aortic root is normal in size and structure. Venous: The inferior vena cava was not well visualized. IAS/Shunts: No atrial level shunt detected by color flow Doppler.  LEFT VENTRICLE PLAX 2D LVIDd:         4.60 cm LVIDs:         2.60 cm LV PW:         1.20 cm LV IVS:        1.60 cm LVOT diam:     2.00 cm LV SV:         34 LV SV Index:   15 LVOT Area:     3.14 cm  RIGHT VENTRICLE RV Basal diam:  4.70 cm RV Mid diam:    3.60 cm RV S prime:     15.30 cm/s LEFT ATRIUM             Index       RIGHT ATRIUM           Index LA diam:        2.40 cm 1.04 cm/m  RA Area:     18.70 cm LA Vol (A2C):   14.3 ml 6.21 ml/m  RA Volume:   58.30 ml  25.32 ml/m LA Vol (A4C):   15.1 ml 6.56 ml/m LA Biplane Vol: 14.9 ml 6.47 ml/m  AORTIC VALVE AV Area (Vmax):    2.57 cm AV Area (Vmean):    2.82 cm AV Area (VTI):     3.00 cm AV Vmax:           83.10 cm/s AV Vmean:          51.900 cm/s AV VTI:            0.112 m AV Peak Grad:      2.8 mmHg AV Mean Grad:      1.0 mmHg LVOT Vmax:         67.90 cm/s LVOT Vmean:        46.600 cm/s LVOT VTI:          0.107 m LVOT/AV VTI ratio: 0.96  AORTA Ao Root diam: 3.10 cm MITRAL VALVE               TRICUSPID VALVE MV Area (PHT): 2.82 cm    TR Peak grad:   10.2 mmHg MV Area VTI:   1.81 cm    TR Vmax:        160.00 cm/s MV Peak grad:  3.9 mmHg MV Mean grad:  2.0 mmHg    SHUNTS MV Vmax:       0.98 m/s    Systemic VTI:  0.11 m MV Vmean:      63.2 cm/s   Systemic Diam: 2.00 cm MV Decel Time: 269 msec MV E velocity: 82.80 cm/s Debbe Odea MD Electronically signed by Debbe Odea MD Signature Date/Time: 01/20/2023/11:55:16 AM    Final    DG Chest 1 View  Result Date: 01/19/2023 CLINICAL DATA:  Chest pain and tachycardia. EXAM: CHEST  1 VIEW COMPARISON:  Chest radiographs 10/11/2014 FINDINGS: Cardiac pacer overlies the inferior left hemithorax. Cardiac silhouettew and mediastinal contours are within normal limits. The lungs are clear. No pleural effusion or pneumothorax. Bridging osteophytes of the lower thoracic spine. Moderate bilateral glenohumeral osteoarthritis. IMPRESSION: No active disease. Electronically Signed   By: Neita Garnet M.D.   On: 01/19/2023 15:31   MR LUMBAR SPINE WO CONTRAST  Result Date: 01/05/2023 CLINICAL DATA:  Low back and right leg pain since March, 2023. EXAM: MRI LUMBAR SPINE WITHOUT CONTRAST TECHNIQUE: Multiplanar, multisequence MR imaging of the lumbar spine was performed. No intravenous contrast was administered. COMPARISON:  None Available. FINDINGS: Segmentation:  Standard. Alignment:  Normal. Vertebrae:  No fracture, evidence of discitis, or bone lesion. Conus medullaris and cauda equina: Conus extends to the L1-2 level. Conus and cauda equina appear normal. Paraspinal and other soft tissues: Negative. Disc levels:  T12-L1: Negative. L1-2: Negative. L2-3: There is a shallow disc bulge and mild facet degenerative disease without stenosis. L3-4: There is a shallow disc bulge, ligamentum flavum thickening and right worse than left mild to moderate facet arthropathy. Disc and endplate spur cause left worse than right subarticular recess narrowing and moderate to moderately severe bilateral foraminal narrowing. L4-5: Moderate facet arthropathy with a shallow disc bulge and ligamentum flavum thickening. There is mild central canal stenosis and moderately severe bilateral foraminal narrowing. L5-S1: Mild-to-moderate bilateral facet arthropathy. There is a shallow disc bulge and endplate spur. The central canal is widely patent. Moderate to moderately severe foraminal narrowing is worse on the left IMPRESSION: 1. Left worse than right subarticular recess narrowing and moderate to moderately severe bilateral foraminal narrowing at L3-4. 2. Mild central canal stenosis and moderately severe bilateral foraminal narrowing at L4-5. 3. Moderate to moderately severe bilateral foraminal narrowing at L5-S1 is worse on the left. Electronically Signed   By: Drusilla Kanner M.D.   On: 01/05/2023 14:55   (Echo, Carotid, EGD, Colonoscopy, ERCP)    Subjective: Pt c/o malaise    Discharge Exam: Vitals:   01/21/23 0740 01/21/23 1201  BP: 114/72 (!) 138/96  Pulse: 89 (!) 101  Resp: 18 18  Temp: 97.9 F (36.6 C) 98.1 F (36.7 C)  SpO2: 94% 97%   Vitals:   01/20/23 2324 01/21/23 0424 01/21/23 0740 01/21/23 1201  BP: 131/79 113/74 114/72 (!) 138/96  Pulse: (!) 108 98 89 (!) 101  Resp: 18 16 18 18   Temp: 98 F (36.7 C) 97.9 F (36.6 C) 97.9 F (36.6 C) 98.1 F (36.7 C)  TempSrc:      SpO2: 94% 98% 94% 97%  Weight:      Height:        General: Pt is alert, awake, not in acute distress Cardiovascular: irregularly irregular, no rubs, no gallops Respiratory: CTA bilaterally, no wheezing, no rhonchi Abdominal: Soft, NT,  obese, bowel sounds + Extremities: no edema, no cyanosis    The results of significant diagnostics from this hospitalization (including imaging, microbiology, ancillary and laboratory) are listed below for reference.     Microbiology: No results found for this or any previous visit (from  the past 240 hour(s)).   Labs: BNP (last 3 results) Recent Labs    01/19/23 1354  BNP 328.2*   Basic Metabolic Panel: Recent Labs  Lab 01/19/23 1354 01/20/23 0550 01/21/23 0446  NA 135 136 137  K 3.4* 3.9 3.7  CL 102 106 106  CO2 25 23 24   GLUCOSE 122* 139* 146*  BUN 14 13 12   CREATININE 0.68 0.63 0.67  CALCIUM 9.1 8.8* 8.9  MG 2.2  --   --    Liver Function Tests: Recent Labs  Lab 01/19/23 1354  AST 20  ALT 25  ALKPHOS 73  BILITOT 1.2  PROT 7.6  ALBUMIN 4.9   No results for input(s): "LIPASE", "AMYLASE" in the last 168 hours. No results for input(s): "AMMONIA" in the last 168 hours. CBC: Recent Labs  Lab 01/19/23 1354 01/20/23 0702 01/21/23 0446  WBC 9.7 9.2 8.3  HGB 16.6 15.2 15.3  HCT 48.4 42.6 42.7  MCV 88.0 86.4 85.4  PLT 178 148* 157   Cardiac Enzymes: No results for input(s): "CKTOTAL", "CKMB", "CKMBINDEX", "TROPONINI" in the last 168 hours. BNP: Invalid input(s): "POCBNP" CBG: No results for input(s): "GLUCAP" in the last 168 hours. D-Dimer No results for input(s): "DDIMER" in the last 72 hours. Hgb A1c Recent Labs    01/20/23 0702  HGBA1C 6.1*   Lipid Profile Recent Labs    01/20/23 0550  CHOL 163  HDL 46  LDLCALC 98  TRIG 97  CHOLHDL 3.5   Thyroid function studies Recent Labs    01/19/23 1354  TSH 7.315*   Anemia work up No results for input(s): "VITAMINB12", "FOLATE", "FERRITIN", "TIBC", "IRON", "RETICCTPCT" in the last 72 hours. Urinalysis No results found for: "COLORURINE", "APPEARANCEUR", "LABSPEC", "PHURINE", "GLUCOSEU", "HGBUR", "BILIRUBINUR", "KETONESUR", "PROTEINUR", "UROBILINOGEN", "NITRITE", "LEUKOCYTESUR" Sepsis  Labs Recent Labs  Lab 01/19/23 1354 01/20/23 0702 01/21/23 0446  WBC 9.7 9.2 8.3   Microbiology No results found for this or any previous visit (from the past 240 hour(s)).   Time coordinating discharge: Over 30 minutes  SIGNED:   Charise Killian, MD  [facility] 01/21/2023, 12:16 PM Pager   If 7PM-7AM, please contact night-coverage [email or web address]

## 2023-01-21 NOTE — Progress Notes (Signed)
Rounding Note    Patient Name: Neil Lopez Date of Encounter: 01/21/2023  [facility] Cardiologist: Dr End  Subjective   Patient seen on a.m. rounds.  Denies any chest pain or shortness of breath or palpitations.  Remains in atrial fibrillation that is rate controlled 70-80.  Has tolerated apixaban and oral diltiazem.  Inpatient Medications    Scheduled Meds:  apixaban  5 mg Oral BID   diltiazem  90 mg Oral Q6H   pantoprazole  40 mg Oral Daily   Continuous Infusions:  PRN Meds: acetaminophen, hydrALAZINE, ondansetron (ZOFRAN) IV   Vital Signs    Vitals:   01/20/23 1923 01/20/23 2324 01/21/23 0424 01/21/23 0740  BP: 108/64 131/79 113/74 114/72  Pulse: 86 (!) 108 98 89  Resp: 19 18 16 18   Temp: 97.8 F (36.6 C) 98 F (36.7 C) 97.9 F (36.6 C) 97.9 F (36.6 C)  TempSrc:      SpO2: 96% 94% 98% 94%  Weight:      Height:        Intake/Output Summary (Last 24 hours) at 01/21/2023 0928 Last data filed at 01/21/2023 0426 Gross per 24 hour  Intake 416.74 ml  Output 400 ml  Net 16.74 ml      01/19/2023    3:16 PM 01/19/2023   12:18 PM  Last 3 Weights  Weight (lbs) 240 lb 240 lb  Weight (kg) 108.863 kg 108.863 kg      Telemetry    Rate controlled atrial fibrillation 70-80- Personally Reviewed  ECG    No new tracings- Personally Reviewed  Physical Exam   GEN: No acute distress.   Neck: No JVD Cardiac: IR IR, no murmurs, rubs, or gallops.  Respiratory: Clear to auscultation bilaterally. GI: Soft, nontender, non-distended  MS: No edema; No deformity. Neuro:  Nonfocal  Psych: Normal affect   Labs    High Sensitivity Troponin:   Recent Labs  Lab 01/19/23 1242 01/19/23 1354 01/19/23 1610  TROPONINIHS 15 18* 14     Chemistry Recent Labs  Lab 01/19/23 1354 01/20/23 0550 01/21/23 0446  NA 135 136 137  K 3.4* 3.9 3.7  CL 102 106 106  CO2 25 23 24   GLUCOSE 122* 139* 146*  BUN 14 13 12   CREATININE 0.68 0.63 0.67   CALCIUM 9.1 8.8* 8.9  MG 2.2  --   --   PROT 7.6  --   --   ALBUMIN 4.9  --   --   AST 20  --   --   ALT 25  --   --   ALKPHOS 73  --   --   BILITOT 1.2  --   --   GFRNONAA >60 >60 >60  ANIONGAP 8 7 7     Lipids  Recent Labs  Lab 01/20/23 0550  CHOL 163  TRIG 97  HDL 46  LDLCALC 98  CHOLHDL 3.5    Hematology Recent Labs  Lab 01/19/23 1354 01/20/23 0702 01/21/23 0446  WBC 9.7 9.2 8.3  RBC 5.50 4.93 5.00  HGB 16.6 15.2 15.3  HCT 48.4 42.6 42.7  MCV 88.0 86.4 85.4  MCH 30.2 30.8 30.6  MCHC 34.3 35.7 35.8  RDW 13.4 13.3 13.1  PLT 178 148* 157   Thyroid  Recent Labs  Lab 01/19/23 1354 01/20/23 0550  TSH 7.315*  --   FREET4  --  1.13*    BNP Recent Labs  Lab 01/19/23 1354  BNP 328.2*    DDimer  No results for input(s): "DDIMER" in the last 168 hours.   Radiology    ECHOCARDIOGRAM COMPLETE  Result Date: 01/20/2023    ECHOCARDIOGRAM REPORT   Patient Name:   Neil Lopez Date of Exam: 01/20/2023 Medical Rec #:  [id number]                Height:       72.0 in Accession #:    [id number]               Weight:       240.0 lb Date of Birth:  21-Mar-1956                BSA:          2.302 m Patient Age:    67 years                 BP:           119/78 mmHg Patient Gender: M                        HR:           84 bpm. Exam Location:  [facility] Procedure: 2D Echo, Cardiac Doppler and Color Doppler Indications:     Atrial Fibrillation I48.91  History:         Patient has no prior history of Echocardiogram examinations.                  Risk Factors:Hypertension.  Sonographer:     Cristela Blue Referring Phys:  [id number] Brien Few NIU Diagnosing Phys: Debbe Odea MD IMPRESSIONS  1. Left ventricular ejection fraction, by estimation, is 60 to 65%. The left ventricle has normal function. The left ventricle has no regional wall motion abnormalities. There is mild left ventricular hypertrophy. Left ventricular diastolic parameters are indeterminate.  2. Right ventricular systolic  function is low normal. The right ventricular size is mildly enlarged.  3. Right atrial size was mildly dilated.  4. The mitral valve is normal in structure. No evidence of mitral valve regurgitation.  5. The aortic valve is tricuspid. Aortic valve regurgitation is not visualized. FINDINGS  Left Ventricle: Left ventricular ejection fraction, by estimation, is 60 to 65%. The left ventricle has normal function. The left ventricle has no regional wall motion abnormalities. The left ventricular internal cavity size was normal in size. There is  mild left ventricular hypertrophy. Left ventricular diastolic parameters are indeterminate. Right Ventricle: The right ventricular size is mildly enlarged. No increase in right ventricular wall thickness. Right ventricular systolic function is low normal. Left Atrium: Left atrial size was normal in size. Right Atrium: Right atrial size was mildly dilated. Pericardium: There is no evidence of pericardial effusion. Mitral Valve: The mitral valve is normal in structure. No evidence of mitral valve regurgitation. MV peak gradient, 3.9 mmHg. The mean mitral valve gradient is 2.0 mmHg. Tricuspid Valve: The tricuspid valve is normal in structure. Tricuspid valve regurgitation is mild. Aortic Valve: The aortic valve is tricuspid. Aortic valve regurgitation is not visualized. Aortic valve mean gradient measures 1.0 mmHg. Aortic valve peak gradient measures 2.8 mmHg. Aortic valve area, by VTI measures 3.00 cm. Pulmonic Valve: The pulmonic valve was not well visualized. Pulmonic valve regurgitation is not visualized. Aorta: The aortic root is normal in size and structure. Venous: The inferior vena cava was not well visualized. IAS/Shunts: No atrial level shunt detected by color flow Doppler.  LEFT VENTRICLE PLAX 2D  LVIDd:         4.60 cm LVIDs:         2.60 cm LV PW:         1.20 cm LV IVS:        1.60 cm LVOT diam:     2.00 cm LV SV:         34 LV SV Index:   15 LVOT Area:     3.14 cm   RIGHT VENTRICLE RV Basal diam:  4.70 cm RV Mid diam:    3.60 cm RV S prime:     15.30 cm/s LEFT ATRIUM             Index       RIGHT ATRIUM           Index LA diam:        2.40 cm 1.04 cm/m  RA Area:     18.70 cm LA Vol (A2C):   14.3 ml 6.21 ml/m  RA Volume:   58.30 ml  25.32 ml/m LA Vol (A4C):   15.1 ml 6.56 ml/m LA Biplane Vol: 14.9 ml 6.47 ml/m  AORTIC VALVE AV Area (Vmax):    2.57 cm AV Area (Vmean):   2.82 cm AV Area (VTI):     3.00 cm AV Vmax:           83.10 cm/s AV Vmean:          51.900 cm/s AV VTI:            0.112 m AV Peak Grad:      2.8 mmHg AV Mean Grad:      1.0 mmHg LVOT Vmax:         67.90 cm/s LVOT Vmean:        46.600 cm/s LVOT VTI:          0.107 m LVOT/AV VTI ratio: 0.96  AORTA Ao Root diam: 3.10 cm MITRAL VALVE               TRICUSPID VALVE MV Area (PHT): 2.82 cm    TR Peak grad:   10.2 mmHg MV Area VTI:   1.81 cm    TR Vmax:        160.00 cm/s MV Peak grad:  3.9 mmHg MV Mean grad:  2.0 mmHg    SHUNTS MV Vmax:       0.98 m/s    Systemic VTI:  0.11 m MV Vmean:      63.2 cm/s   Systemic Diam: 2.00 cm MV Decel Time: 269 msec MV E velocity: 82.80 cm/s Debbe Odea MD Electronically signed by Debbe Odea MD Signature Date/Time: 01/20/2023/11:55:16 AM    Final    DG Chest 1 View  Result Date: 01/19/2023 CLINICAL DATA:  Chest pain and tachycardia. EXAM: CHEST  1 VIEW COMPARISON:  Chest radiographs 10/11/2014 FINDINGS: Cardiac pacer overlies the inferior left hemithorax. Cardiac silhouettew and mediastinal contours are within normal limits. The lungs are clear. No pleural effusion or pneumothorax. Bridging osteophytes of the lower thoracic spine. Moderate bilateral glenohumeral osteoarthritis. IMPRESSION: No active disease. Electronically Signed   By: Neita Garnet M.D.   On: 01/19/2023 15:31    Cardiac Studies  TTE 01/20/23 1. Left ventricular ejection fraction, by estimation, is 60 to 65%. The  left ventricle has normal function. The left ventricle has no regional  wall  motion abnormalities. There is mild left ventricular hypertrophy.  Left ventricular diastolic parameters  are indeterminate.   2. Right  ventricular systolic function is low normal. The right  ventricular size is mildly enlarged.   3. Right atrial size was mildly dilated.   4. The mitral valve is normal in structure. No evidence of mitral valve  regurgitation.   5. The aortic valve is tricuspid. Aortic valve regurgitation is not  visualized.   Patient Profile     67 y.o. male with a past medical history of hypertension, hyperlipidemia, depression, obesity, obstructive sleep apnea on CPAP, who was admitted on 01/20/2023 and is being followed for atypical chest pain and new onset atrial fibrillation RVR.  Assessment & Plan    New onset atrial fibrillation with RVR -Patient started having symptoms on Wednesday, 01/18/2023 -Has been on diltiazem 90 mg every 6 hours -Transitioning into 360 mg of diltiazem once daily -Is on apixaban 5 mg twice daily for CHA2DS2-VASc score of least 2 for stroke prophylaxis -Echocardiogram completed which revealed an LVEF of 60-65%, no regional wall motion abnormalities, and without valvular abnormalities -Recommend having uninterrupted anticoagulation for 3 to 4 weeks and then if he has not spontaneously converted back to sinus rhythm he will be set up for outpatient cardioversion procedure -Remains in atrial fibrillation this morning but rates are controlled  Atypical chest pain -Chest pain has resolved -No ischemic changes noted on EKG -High-sensitivity troponins negative -Will revisit outpatient ischemic workup  Hypertension -Blood pressure 114/72 -Continued on diltiazem -Advised not to take PTA amlodipine once he returns home -Vital signs per unit protocol  OSA -Patient states he is compliant with CPAP -Continue with nightly CPAP usage  Obesity -Encouraged to continue with ambulate in his 2 miles daily -Recommend decreasing caloric intake and  increasing activity     For questions or updates, please contact [facility] Please consult [email or web address] for contact info under        Signed, Gemayel Mascio, NP  01/21/2023, 9:28 AM

## 2023-01-22 LAB — T3, FREE: T3, Free: 4 pg/mL (ref 2.0–4.4)

## 2023-01-23 ENCOUNTER — Telehealth: Payer: Self-pay | Admitting: Cardiology

## 2023-01-23 NOTE — Telephone Encounter (Signed)
   Transition of Care Follow-up Phone Call Request    Patient Name: Neil Lopez Date of Birth: [birth year] Date of Encounter: 01/23/2023  Primary Care Provider:  [facility] Primary Cardiologist:  None  Neil Lopez needs to be scheduled for a transition of care follow up appointment with a HeartCare provider:  Hospital follow-up in 1-2 weeks for new onset atrial fibrillation, will need EKG and discuss cardioversion on return. Can see Dr End or APP.  Please reach out to Neil Lopez within 48 hours to confirm appointment and review transition of care protocol questionnaire.  Neil Vernet, NP  01/23/2023, 3:15 PM

## 2023-02-17 ENCOUNTER — Ambulatory Visit: Payer: Federal, State, Local not specified - PPO | Attending: Cardiology | Admitting: Cardiology

## 2023-02-17 ENCOUNTER — Encounter: Payer: Self-pay | Admitting: Cardiology

## 2023-02-17 VITALS — BP 130/70 | HR 66 | Ht 72.0 in | Wt 234.8 lb

## 2023-02-17 DIAGNOSIS — I10 Essential (primary) hypertension: Secondary | ICD-10-CM | POA: Diagnosis not present

## 2023-02-17 DIAGNOSIS — R7989 Other specified abnormal findings of blood chemistry: Secondary | ICD-10-CM

## 2023-02-17 DIAGNOSIS — E669 Obesity, unspecified: Secondary | ICD-10-CM

## 2023-02-17 DIAGNOSIS — I4891 Unspecified atrial fibrillation: Secondary | ICD-10-CM

## 2023-02-17 DIAGNOSIS — G4733 Obstructive sleep apnea (adult) (pediatric): Secondary | ICD-10-CM | POA: Diagnosis not present

## 2023-02-17 NOTE — Patient Instructions (Signed)
Medication Instructions:  No changes at this time.   *If you need a refill on your cardiac medications before your next appointment, please call your pharmacy*   Lab Work: None  If you have labs (blood work) drawn today and your tests are completely normal, you will receive your results only by: MyChart Message (if you have MyChart) OR A paper copy in the mail If you have any lab test that is abnormal or we need to change your treatment, we will call you to review the results.   Testing/Procedures: None   Follow-Up: At [facility], you and your health needs are our priority.  As part of our continuing mission to provide you with exceptional heart care, we have created designated Provider Care Teams.  These Care Teams include your primary Cardiologist (physician) and Advanced Practice Providers (APPs -  Physician Assistants and Nurse Practitioners) who all work together to provide you with the care you need, when you need it.     Your next appointment:   8 week(s)  Provider:   Yvonne Kendall, MD

## 2023-02-17 NOTE — Progress Notes (Signed)
Cardiology Office Note:  .   Date:  02/17/2023  ID:  Neil Lopez, DOB 01-23-1956, MRN [id number] PCP: Administration, [facility] Providers Cardiologist:  Yvonne Kendall, MD    History of Present Illness: .   Neil Lopez is a 67 y.o. male with a past medical history of hypertension, hyperlipidemia, depression, obesity, obstructive sleep apnea on CPAP, and atrial fibrillation with RVR who is here today for follow-up after recent hospitalization.  Associated send vaginal who presented to the [facility] urgent care 01/19/2023 with complaint of chest pain, headaches, fast heart rate, and low blood pressure.  He had recently thought that his symptoms were caused by recent epidural injection that he had received and followed up with Dr. Eppie Gibson office and was advised to state that he should be evaluated by urgent care etc. typically not symptoms caused by the injections.  He had sudden onset chest discomfort and his wife felt his pulse in his wrist to calculate his pulse (who is a retired Engineer, civil (consulting)) and noted that it was abnormal and urged him to be evaluated.  EKG revealed SVT at a rate of 137 he was advised he needed further follow-up in the emergency department.  While in the emergency department he was found to be in A-fib RVR with no previous A-fib history.  He had previously been on amlodipine for high blood pressure been treated for chronic back pain with a recent epidural injection.  2 years ago he stated he had an exercise stress test that was done and then underwent nuclear stress testing both were unrevealing.  In the emergency department blood pressure was 118/86, heart rate 140, respiration 18, temperature 98.3, pertinent labs revealed a BNP of 328.2, high-sensitivity troponin of 15, 18, 14, urine drug screen positive for cannabinoid.  He was given diltiazem bolus and started on a diltiazem drip and heparin infusion.  Echo was completed that showed EF 60 to 65%, no  regional wall motion abnormalities, mild LVH, no valvular abnormalities.  He was transition from diltiazem IV to oral dosing heparin was transition to apixaban and he was considered stable for discharge on 01/21/2023.  He returns to clinic today accompanied by his wife.  Overall he states that he has been doing well.  He suffers from a headache that he states is getting better since being on the diltiazem he had and when he arrived to the hospital and it has persisted.  He occasionally has some indigestion and has to take Tums.  Stated that he just recently followed up with his primary care provider at the [facility] and was told that his hemoglobin and kidney function were stable.  He has been tolerating the apixaban without concerns for bleeding within no blood noted in his urine or stool.  He has been keeping a log of his blood pressures and heart rates and elevated stable since discharge from the hospital.  He has questions today about his community care as well as he stated he was told that the billing cycle had closed.  ROS: 10 point review of systems has been completed and considered negative with exception of what is been listed in the HPI  Studies Reviewed: Marland Kitchen   EKG Interpretation Date/Time:  Friday February 17 2023 09:58:49 EDT Ventricular Rate:  66 PR Interval:  158 QRS Duration:  92 QT Interval:  430 QTC Calculation: 450 R Axis:   -11  Text Interpretation: Normal sinus rhythm Normal ECG When compared with ECG of 19-Jan-2023 13:50, Sinus  rhythm has replaced Atrial fibrillation Vent. rate has decreased BY  79 BPM Questionable change in QRS axis Nonspecific T wave abnormality has replaced inverted T waves in Inferior leads Confirmed by Charlsie Quest ([id number]) on 02/17/2023 10:05:34 AM   TTE 01/20/23 1. Left ventricular ejection fraction, by estimation, is 60 to 65%. The  left ventricle has normal function. The left ventricle has no regional  wall motion abnormalities. There is mild left ventricular  hypertrophy.  Left ventricular diastolic parameters  are indeterminate.   2. Right ventricular systolic function is low normal. The right  ventricular size is mildly enlarged.   3. Right atrial size was mildly dilated.   4. The mitral valve is normal in structure. No evidence of mitral valve  regurgitation.   5. The aortic valve is tricuspid. Aortic valve regurgitation is not  visualized.   Risk Assessment/Calculations:    CHA2DS2-VASc Score = 2   This indicates a 2.2% annual risk of stroke. The patient's score is based upon: CHF History: 0 HTN History: 1 Diabetes History: 0 Stroke History: 0 Vascular Disease History: 0 Age Score: 1 Gender Score: 0            Physical Exam:   VS:  BP 130/70 (BP Location: Left Arm, Patient Position: Sitting, Cuff Size: Normal)   Pulse 66   Ht 6' (1.829 m)   Wt 234 lb 12.8 oz (106.5 kg)   SpO2 96%   BMI 31.84 kg/m    Wt Readings from Last 3 Encounters:  02/17/23 234 lb 12.8 oz (106.5 kg)  01/19/23 240 lb (108.9 kg)  01/19/23 240 lb (108.9 kg)    GEN: Well nourished, well developed in no acute distress NECK: No JVD; No carotid bruits CARDIAC: RRR, no murmurs, rubs, gallops RESPIRATORY:  Clear to auscultation without rales, wheezing or rhonchi  ABDOMEN: Soft, non-tender, non-distended EXTREMITIES:  No edema; No deformity   ASSESSMENT AND PLAN: .   New onset atrial fibrillation where he was recently evaluated in the [facility] emergency department for atrial fibrillation RVR.  EKG today reveals sinus rhythm rate of 66.  He has been continued on diltiazem 360 mg daily and apixaban 5 mg twice daily for CHA2DS2-VASc score of 2 for stroke prophylaxis.  Echocardiogram completed in the hospital revealed an LVEF of 60 to 65%, no regional wall motion abnormalities and no valvular abnormalities.  His previous was discussed during his hospitalization if he can remain in atrial fibrillation he would need 3 to 4 weeks of uninterrupted anticoagulation and  then would undergo cardioversion procedure.  He is relieved today being in sinus rhythm and that there is no need for cardioversion at this time.  We have discussed triggers for atrial fibrillation including fatigue, caffeine, stress, anemia, and the importance of medications.  He is working to decrease the amount of caffeine that he has daily but has not had any recurrent palpitations or lightheaded or dizziness since discharge from the hospital.  Patient was also noted to have mildly abnormal thyroid functions in the hospital he will continue to need follow-up with his PCP for this.  Mildly elevated high-sensitivity troponin in the hospital with original complaints of chest pain and palpitations on arrival.  High-sensitivity troponin peaked at 18.  He stated that he had negative stress testing completed in 2022.  With his complaints of indigestion and mild [place] with new onset atrial fibrillation we did discuss repeating his pharmacological stress test with concerns for his community care at this time we will  defer ordering any testing until [facility].  Primary hypertension blood pressure today 130/70.  He brought in a blood pressure log today and blood pressures have been stable and well-controlled.  He is continued on diltiazem 360 mg daily PTA amlodipine was discontinued prior to the start of diltiazem.  He is encouraged to continue with monitoring his blood pressures 1-2 times per week or as needed.  Obstructive sleep apnea wearing continues to remain compliant with his CPAP usage.  Obesity with a BMI of 31.8.  He is encouraged to increase his activity to build his stamina back up and decrease his caloric intake.  Patient states prior to he was walking 2 to 3 miles a day but on discharge he was advised to avoid any strenuous activity until his follow-up.  Patient is encouraged to get back into his walking program today       Dispo: Patient to return to clinic to see MD/APP  in 8 weeks or sooner if needed with reevaluation of symptoms.  The patient states that he will call Monday to reinitiate his community care through the [place].  He can follow-up with his primary cardiologist Dr. Lowell Bouton or return to determine if any further ischemic workup is needed.  Signed, Abou Sterkel, NP

## 2023-02-28 ENCOUNTER — Telehealth: Payer: Self-pay | Admitting: Cardiology

## 2023-02-28 NOTE — Telephone Encounter (Signed)
Consuela with [facility] states their provider can see patient's EKG from 8/09 appointment, but they are unable to view all of it. Per Judye Bos, their provider would like to have EKG faxed to their office at [phone].  Phone#: [phone] (ext#: [phone])

## 2023-02-28 NOTE — Telephone Encounter (Signed)
Faxed EKG results to number as requested

## 2023-04-24 ENCOUNTER — Ambulatory Visit: Payer: No Typology Code available for payment source | Admitting: Internal Medicine
